# Patient Record
Sex: Male | Born: 1960 | Race: White | Hispanic: No | Marital: Married | Smoking: Never smoker
Health system: Southern US, Community
[De-identification: ages and names within clinical notes are randomized; demographics above are authoritative.]

## PROBLEM LIST (undated history)

## (undated) DIAGNOSIS — I480 Paroxysmal atrial fibrillation: Secondary | ICD-10-CM

## (undated) DIAGNOSIS — I493 Ventricular premature depolarization: Secondary | ICD-10-CM

## (undated) DIAGNOSIS — R002 Palpitations: Secondary | ICD-10-CM

## (undated) DIAGNOSIS — R5383 Other fatigue: Secondary | ICD-10-CM

## (undated) HISTORY — PX: ATRIAL FLUTTER ABLATION: SHX5733

## (undated) HISTORY — PX: TONSILLECTOMY: SUR1361

## (undated) HISTORY — PX: OTHER SURGICAL HISTORY: SHX169

## (undated) HISTORY — PX: COLONOSCOPY: SHX174

## (undated) HISTORY — PX: RECONSTRUCTION OF NOSE: SHX2301

## (undated) HISTORY — DX: Other fatigue: R53.83

## (undated) HISTORY — DX: Palpitations: R00.2

## (undated) HISTORY — DX: Paroxysmal atrial fibrillation: I48.0

## (undated) HISTORY — DX: Ventricular premature depolarization: I49.3

---

## 2020-06-25 ENCOUNTER — Encounter: Payer: Self-pay | Admitting: Cardiology

## 2020-06-25 ENCOUNTER — Ambulatory Visit (INDEPENDENT_AMBULATORY_CARE_PROVIDER_SITE_OTHER): Payer: 59 | Admitting: Cardiology

## 2020-06-25 ENCOUNTER — Other Ambulatory Visit: Payer: Self-pay

## 2020-06-25 VITALS — BP 100/64 | HR 63 | Ht 71.5 in | Wt 153.6 lb

## 2020-06-25 DIAGNOSIS — I480 Paroxysmal atrial fibrillation: Secondary | ICD-10-CM | POA: Insufficient documentation

## 2020-06-25 DIAGNOSIS — I4892 Unspecified atrial flutter: Secondary | ICD-10-CM

## 2020-06-25 NOTE — Progress Notes (Signed)
Electrophysiology Office Note:    Date:  06/25/2020   ID:  Richard Walsh, DOB Jan 10, 1961, MRN [id number]  PCP:  Ginger Organ  [place] Cardiologist:  No primary care provider on file.  [facility] Electrophysiologist:  None   Referring MD: Cory Munch, PA-C   Chief Complaint: Atrial fibrillation  History of Present Illness:    Richard Walsh is a 59 y.o. male who presents for an evaluation of atrial fibrillation at the request of Collene Mares, PA-C. Their medical history includes atrial fibrillation, atrial flutter post atrial flutter ablation 2017, PVCs.  He is recently moved to the area from [place] where he was being followed by the [facility].  He tells me that he first was diagnosed with atrial flutter approximately 4 years ago.  He was symptomatic with those and underwent a catheter ablation on June 17, 2016.  During the procedure the CTI was ablated.  Multiple episodes of atrial fibrillation were induced during the atrial flutter ablation requiring cardioversion.  Xarelto was used periprocedurally but then discontinued 1 month after the ablation procedure.  He had a loop recorder implanted to monitor for atrial fibrillation.  He began having episodes of atrial fibrillation on the loop and these episodes have become more frequent over time.  Sotalol was used and then uptitrated from 80 mg twice daily to 120 mg twice daily.  He tells me today that the sotalol seem to help the atrial fibrillation episodes but recently he is noted his heart to be out of rhythm more frequently.  Review of his loop recorder tracings confirm this.  He tells me today that he is not very active but does move about the house in the yard doing work.  He notices no limitations in his exercise capacity.  No syncope or presyncope.  He feels his palpitations sometimes when he lays down.    Past Medical History:  Diagnosis Date  . Fatigue   . Palpitations   .  Paroxysmal atrial fibrillation (HCC)   . Ventricular premature beats     Past Surgical History:  Procedure Laterality Date  . ABLATE HEART DYSRHYTHM FOCUS    . ATRIAL FLUTTER ABLATION    . IMPLANTABLE LOOP RECORDER    . RECONSTRUCTION OF NOSE    . TONSILLECTOMY      Current Medications: Current Meds  Medication Sig  . ergocalciferol (VITAMIN D2) 1.25 MG (50000 UT) capsule Take 50,000 Units by mouth once a week.  . sotalol (BETAPACE) 120 MG tablet Take 120 mg by mouth in the morning and at bedtime.  . sotalol (BETAPACE) 80 MG tablet Take 80 mg by mouth 2 (two) times daily.  Marland Kitchen VITAMIN D PO Take 50,000 Units by mouth.     Allergies:   Penicillins   Social History   Socioeconomic History  . Marital status: Married    Spouse name: Not on file  . Number of children: Not on file  . Years of education: Not on file  . Highest education level: Not on file  Occupational History  . Not on file  Tobacco Use  . Smoking status: Never Smoker  . Smokeless tobacco: Never Used  Substance and Sexual Activity  . Alcohol use: Not on file  . Drug use: Not on file  . Sexual activity: Not on file  Other Topics Concern  . Not on file  Social History Narrative  . Not on file   Social Determinants of Health   Financial  Resource Strain:   . Difficulty of Paying Living Expenses: Not on file  Food Insecurity:   . Worried About Charity fundraiser in the Last Year: Not on file  . Ran Out of Food in the Last Year: Not on file  Transportation Needs:   . Lack of Transportation (Medical): Not on file  . Lack of Transportation (Non-Medical): Not on file  Physical Activity:   . Days of Exercise per Week: Not on file  . Minutes of Exercise per Session: Not on file  Stress:   . Feeling of Stress : Not on file  Social Connections:   . Frequency of Communication with Friends and Family: Not on file  . Frequency of Social Gatherings with Friends and Family: Not on file  . Attends Religious  Services: Not on file  . Active Member of Clubs or Organizations: Not on file  . Attends Archivist Meetings: Not on file  . Marital Status: Not on file     Family History: The patient's family history is not on file.  ROS:   Please see the history of present illness.    All other systems reviewed and are negative.  EKGs/Labs/Other Studies Reviewed:    The following studies were reviewed today: Prior notes from [place], EKG  EKG:  The ekg ordered today demonstrates sinus rhythm  Loop interrogation in office personally reviewed shows 2 episodes of atrial fibrillation in October, 3 in September, 2 in August, 2 in July, 3 in June, 6 in May, 12 in April.  Episodes range from a few seconds to over 6-1/2 hours with ventricular rates.  Review of the tracings shows what appears to be either coarse atrial fibrillation or atrial flutter  Recent Labs: No results found for requested labs within last 8760 hours.  Recent Lipid Panel No results found for: CHOL, TRIG, HDL, CHOLHDL, VLDL, LDLCALC, LDLDIRECT  Physical Exam:    VS:  BP 100/64   Pulse 63   Ht 5' 11.5" (1.816 m)   Wt 153 lb 9.6 oz (69.7 kg)   SpO2 97%   BMI 21.12 kg/m     Wt Readings from Last 3 Encounters:  06/25/20 153 lb 9.6 oz (69.7 kg)     GEN:  Well nourished, well developed in no acute distress HEENT: Normal NECK: No JVD; No carotid bruits LYMPHATICS: No lymphadenopathy CARDIAC: RRR, no murmurs, rubs, gallops RESPIRATORY:  Clear to auscultation without rales, wheezing or rhonchi  ABDOMEN: Soft, non-tender, non-distended MUSCULOSKELETAL:  No edema; No deformity  SKIN: Warm and dry NEUROLOGIC:  Alert and oriented x 3 PSYCHIATRIC:  Normal affect   ASSESSMENT:    1. Paroxysmal atrial fibrillation (HCC)    PLAN:    In order of problems listed above:  1. Paroxysmal atrial fibrillation and flutter Post atrial flutter ablation in 2017.  Patient continues to have (mildly) symptomatic episodes of atrial  fibrillation and possibly atrial flutter on his loop recorder.  He is not currently anticoagulated given a CHA2DS2-VASc of 0.  He is interested in definitive management for his arrhythmias given the fear that these will progress as he ages and become more symptomatic.  He understands the pathophysiology of atrial fibrillation and flutter and the fact that these will likely become more frequent and longer in duration.  We discussed the options for managing his atrial fibrillation including #1 continued watchful waiting and monitoring of his loop recorder to detect a change in frequency of the atrial arrhythmias, #2 antiarrhythmic therapy and #  3 ablation therapy.  Antiarrhythmic options for Mr. Dunker include class Ic's, dronedarone, amiodarone, and Tikosyn.  It is concerning to me that despite being on a good dose of sotalol he continues to have episodes of atrial arrhythmias.  Given that fact, I would not use class Ic 0 dronedarone.  Given his young age, amiodarone is not a good option.  That leaves Tikosyn.  After discussing the use of Tikosyn with the patient,'s pacifically the requirement that this medicine be started as an inpatient, the patient is understandably hesitant to pursue this option.  I discussed ablation at length during today's visit with the patient and his wife including the risks, benefits, expected success rates and potential need for future procedures or antiarrhythmic therapy.  He plans to think about it after leaving the office today and will let us know if he would like to schedule.  We provided him with several procedure dates in the coming months.  If he would like to schedule, we will need to start him on anticoagulation (Xarelto) for at least 3 weeks prior to the procedure.  This will be continued for at least 3 months after the procedure.  We will also need a CT scan to assess his cardiac anatomy especially in the presence of his pectus excavatum.  Risk, benefits, and alternatives  to EP study and radiofrequency ablation for afib were also discussed in detail today. These risks include but are not limited to stroke, bleeding, vascular damage, tamponade, perforation, damage to the esophagus, lungs, and other structures, pulmonary vein stenosis, worsening renal function, and death. The patient understands these risk.  Carto, ICE, anesthesia are requested for the procedure.  Will also obtain CT PV protocol prior to the procedure to exclude LAA thrombus and further evaluate atrial anatomy.    Medication Adjustments/Labs and Tests Ordered: Current medicines are reviewed at length with the patient today.  Concerns regarding medicines are outlined above.  Orders Placed This Encounter  Procedures  . EKG 12-Lead   No orders of the defined types were placed in this encounter.    Signed, Lars Mage, MD, [facility]  06/25/2020 6:14 PM    Electrophysiology [facility]

## 2020-06-25 NOTE — Patient Instructions (Addendum)
Medication Instructions:  Your physician recommends that you continue on your current medications as directed. Please refer to the Current Medication list given to you today.  Labwork: None ordered.  Testing/Procedures: None ordered.  Follow-Up:  The following dates are available in January: January 3, 7, 10, 14, 17, 24, 28  Any Other Special Instructions Will Be Listed Below (If Applicable).  If you need a refill on your cardiac medications before your next appointment, please call your pharmacy.    Cardiac Ablation Cardiac ablation is a procedure to disable (ablate) a small amount of heart tissue in very specific places. The heart has many electrical connections. Sometimes these connections are abnormal and can cause the heart to beat very fast or irregularly. Ablating some of the problem areas can improve the heart rhythm or return it to normal. Ablation may be done for people who:  Have Wolff-Parkinson-White syndrome.  Have fast heart rhythms (tachycardia).  Have taken medicines for an abnormal heart rhythm (arrhythmia) that were not effective or caused side effects.  Have a high-risk heartbeat that may be life-threatening. During the procedure, a small incision is made in the neck or the groin, and a long, thin, flexible tube (catheter) is inserted into the incision and moved to the heart. Small devices (electrodes) on the tip of the catheter will send out electrical currents. A type of X-ray (fluoroscopy) will be used to help guide the catheter and to provide images of the heart. Tell a health care provider about:  Any allergies you have.  All medicines you are taking, including vitamins, herbs, eye drops, creams, and over-the-counter medicines.  Any problems you or family members have had with anesthetic medicines.  Any blood disorders you have.  Any surgeries you have had.  Any medical conditions you have, such as kidney failure.  Whether you are pregnant or may be  pregnant. What are the risks? Generally, this is a safe procedure. However, problems may occur, including:  Infection.  Bruising and bleeding at the catheter insertion site.  Bleeding into the chest, especially into the sac that surrounds the heart. This is a serious complication.  Stroke or blood clots.  Damage to other structures or organs.  Allergic reaction to medicines or dyes.  Need for a permanent pacemaker if the normal electrical system is damaged. A pacemaker is a small computer that sends electrical signals to the heart and helps your heart beat normally.  The procedure not being fully effective. This may not be recognized until months later. Repeat ablation procedures are sometimes required. What happens before the procedure?  Follow instructions from your health care provider about eating or drinking restrictions.  Ask your health care provider about: ? Changing or stopping your regular medicines. This is especially important if you are taking diabetes medicines or blood thinners. ? Taking medicines such as aspirin and ibuprofen. These medicines can thin your blood. Do not take these medicines before your procedure if your health care provider instructs you not to.  Plan to have someone take you home from the hospital or clinic.  If you will be going home right after the procedure, plan to have someone with you for 24 hours. What happens during the procedure?  To lower your risk of infection: ? Your health care team will wash or sanitize their hands. ? Your skin will be washed with soap. ? Hair may be removed from the incision area.  An IV tube will be inserted into one of your veins.  You  will be given a medicine to help you relax (sedative).  The skin on your neck or groin will be numbed.  An incision will be made in your neck or your groin.  A needle will be inserted through the incision and into a large vein in your neck or groin.  A catheter will be  inserted into the needle and moved to your heart.  Dye may be injected through the catheter to help your surgeon see the area of the heart that needs treatment.  Electrical currents will be sent from the catheter to ablate heart tissue in desired areas. There are three types of energy that may be used to ablate heart tissue: ? Heat (radiofrequency energy). ? Laser energy. ? Extreme cold (cryoablation).  When the necessary tissue has been ablated, the catheter will be removed.  Pressure will be held on the catheter insertion area to prevent excessive bleeding.  A bandage (dressing) will be placed over the catheter insertion area. The procedure may vary among health care providers and hospitals. What happens after the procedure?  Your blood pressure, heart rate, breathing rate, and blood oxygen level will be monitored until the medicines you were given have worn off.  Your catheter insertion area will be monitored for bleeding. You will need to lie still for a few hours to ensure that you do not bleed from the catheter insertion area.  Do not drive for 24 hours or as long as directed by your health care provider. Summary  Cardiac ablation is a procedure to disable (ablate) a small amount of heart tissue in very specific places. Ablating some of the problem areas can improve the heart rhythm or return it to normal.  During the procedure, electrical currents will be sent from the catheter to ablate heart tissue in desired areas. This information is not intended to replace advice given to you by your health care provider. Make sure you discuss any questions you have with your health care provider. Document Revised: 01/30/2018 Document Reviewed: 06/28/2016 Elsevier Patient Education  [place].

## 2020-06-26 ENCOUNTER — Telehealth: Payer: Self-pay | Admitting: Cardiology

## 2020-06-26 MED ORDER — SOTALOL HCL 120 MG PO TABS
120.0000 mg | ORAL_TABLET | Freq: Two times a day (BID) | ORAL | 3 refills | Status: DC
Start: 2020-06-26 — End: 2021-06-22

## 2020-06-26 NOTE — Telephone Encounter (Signed)
New message      *STAT* If patient is at the pharmacy, call can be transferred to refill team.   1. Which medications need to be refilled? (please list name of each medication and dose if known) sotalol (BETAPACE) 120 MG tablet  2. Which pharmacy/location (including street and city if local pharmacy) is medication to be sent to?  [facility] [address]   3. Do they need a 30 day or 90 day supply? [place]

## 2020-06-26 NOTE — Telephone Encounter (Signed)
Pt's medication was sent to pt's pharmacy as requested. Pt stated that he now takes sotalol 120 mg BID.  That's what was sent to pt's pharmacy. Confirmation received.

## 2020-06-27 ENCOUNTER — Telehealth: Payer: Self-pay | Admitting: Cardiology

## 2020-06-27 DIAGNOSIS — I4891 Unspecified atrial fibrillation: Secondary | ICD-10-CM

## 2020-06-27 NOTE — Telephone Encounter (Signed)
Pt called and would like to sch his Cardiac ablation .    Best number -[phone]

## 2020-06-30 MED ORDER — RIVAROXABAN 20 MG PO TABS: 20.0000 mg | ORAL_TABLET | Freq: Every day | ORAL | 11 refills | Status: DC

## 2020-06-30 NOTE — Telephone Encounter (Signed)
Pt is scheduled for afib ablation on September 08, 2020 at 10:30 AM.  Pt will contact this nurse to advise of lab date prior to procedure.

## 2020-07-01 ENCOUNTER — Telehealth: Payer: Self-pay | Admitting: Emergency Medicine

## 2020-07-01 NOTE — Telephone Encounter (Signed)
[facility] in [place] , [place] to request transfer of patient to [facility]. Left message for Yolande Jolly in device clinic to return call with DC # and office hours.  Need seriel # of Loop Recorder and industry.

## 2020-07-01 NOTE — Telephone Encounter (Signed)
-----   Message from Damian Leavell, RN sent at 07/01/2020  8:34 AM EST ----- Regarding: loop recorder-i can't remember who checked him.  new patient

## 2020-07-01 NOTE — Telephone Encounter (Signed)
Patient released to our clinic in [place].

## 2020-07-24 NOTE — Telephone Encounter (Signed)
Pt scheduled for afib ablation.  Work up complete

## 2020-08-12 ENCOUNTER — Other Ambulatory Visit: Payer: Self-pay

## 2020-08-12 ENCOUNTER — Telehealth: Payer: Self-pay | Admitting: Cardiology

## 2020-08-12 ENCOUNTER — Other Ambulatory Visit: Payer: 59

## 2020-08-12 DIAGNOSIS — I4891 Unspecified atrial fibrillation: Secondary | ICD-10-CM

## 2020-08-12 NOTE — Telephone Encounter (Signed)
**Note De-Identified [name] Obfuscation** The pt states that he was not given a Xarelto co-pay card. I advised him that we will leave one in our front office for him to pick up today while at the office for lab work.  Per his request I will do the PA on his Eliquis as well.

## 2020-08-12 NOTE — Telephone Encounter (Signed)
**Note De-Identified Meeghan Skipper Obfuscation** Per the pts chart he has [employer] so he should be able to use a $10 co-pay card. I will call the pt to verify and if this is correct I will ask someone at the office to leave the pt a Xarelto co-pay card in the front office so the pt can pick it up.

## 2020-08-12 NOTE — Telephone Encounter (Signed)
Will do, thanks

## 2020-08-12 NOTE — Telephone Encounter (Signed)
New Message  Pt c/o medication issue:  1. Name of Medication: rivaroxaban (XARELTO) 20 MG TABS tablet  2. How are you currently taking this medication (dosage and times per day)? 1 tablet daily   3. Are you having a reaction (difficulty breathing--STAT)? No   4. What is your medication issue? Pt says the pharmacy will not refill without Prior Auth  Pt had this number for his insurance for Prior Auth with his Insurance  [phone]

## 2020-08-13 ENCOUNTER — Telehealth: Payer: Self-pay

## 2020-08-13 LAB — CBC WITH DIFFERENTIAL/PLATELET
Basophils Absolute: 0 10*3/uL (ref 0.0–0.2)
Basos: 0 %
EOS (ABSOLUTE): 0.1 10*3/uL (ref 0.0–0.4)
Eos: 2 %
Hematocrit: 45 % (ref 37.5–51.0)
Hemoglobin: 15.4 g/dL (ref 13.0–17.7)
Immature Grans (Abs): 0 10*3/uL (ref 0.0–0.1)
Immature Granulocytes: 0 %
Lymphocytes Absolute: 1.8 10*3/uL (ref 0.7–3.1)
Lymphs: 29 %
MCH: 31.3 pg (ref 26.6–33.0)
MCHC: 34.2 g/dL (ref 31.5–35.7)
MCV: 92 fL (ref 79–97)
Monocytes Absolute: 0.6 10*3/uL (ref 0.1–0.9)
Monocytes: 10 %
Neutrophils Absolute: 3.8 10*3/uL (ref 1.4–7.0)
Neutrophils: 59 %
Platelets: 238 10*3/uL (ref 150–450)
RBC: 4.92 x10E6/uL (ref 4.14–5.80)
RDW: 12.6 % (ref 11.6–15.4)
WBC: 6.3 10*3/uL (ref 3.4–10.8)

## 2020-08-13 LAB — BASIC METABOLIC PANEL
BUN/Creatinine Ratio: 19 (ref 9–20)
BUN: 16 mg/dL (ref 6–24)
CO2: 27 mmol/L (ref 20–29)
Calcium: 9.7 mg/dL (ref 8.7–10.2)
Chloride: 101 mmol/L (ref 96–106)
Creatinine, Ser: 0.83 mg/dL (ref 0.76–1.27)
GFR calc Af Amer: 113 mL/min/{1.73_m2} (ref >59)
GFR calc non Af Amer: 98 mL/min/{1.73_m2} (ref >59)
Glucose: 106 mg/dL — ABNORMAL HIGH (ref 65–99)
Potassium: 4.6 mmol/L (ref 3.5–5.2)
Sodium: 142 mmol/L (ref 134–144)

## 2020-08-13 NOTE — Telephone Encounter (Signed)
**Note De-Identified Nafisah Runions Obfuscation** Using ins information provided by [facility] as follows: ID: [id number] BIN: [id number] PCN: [id number] GRP: [id number]  I did a Eliquis PA through covermymeds and received the following message: Particia Lather Key: [facility] [place] Case ID: [id number] Need help? Call us at [phone] Outcome: This medication or product is on your plan's list of covered drugs. Prior authorization is not required at this time. If your pharmacy has questions regarding the processing of your prescription, please have them call the [facility] pharmacy help desk at [phone].  Drug: Xarelto 20MG  tablets Form: [facility] Electronic Prior Authorization Form (2017 NCPDP)  I have notified [facility] of this outcome.

## 2020-08-13 NOTE — Telephone Encounter (Signed)
**Note De-Identified Richard Walsh Obfuscation** Letter received from [facility] Nathanie Ottley fax stating that they did cancel the pts Xarelto PA as it is not required and is covered by the pts plan.

## 2020-09-01 ENCOUNTER — Telehealth (HOSPITAL_COMMUNITY): Payer: Self-pay | Admitting: Emergency Medicine

## 2020-09-01 NOTE — Telephone Encounter (Signed)
Attempted to call patient regarding upcoming cardiac CT appointment. °Left message on voicemail with name and callback number °Vihan Santagata RN Navigator Cardiac Imaging °[facility] °[phone] Office °[phone] Cell ° °

## 2020-09-02 ENCOUNTER — Other Ambulatory Visit: Payer: Self-pay

## 2020-09-02 ENCOUNTER — Ambulatory Visit (HOSPITAL_COMMUNITY)
Admission: RE | Admit: 2020-09-02 | Discharge: 2020-09-02 | Disposition: A | Discharge status: Home / Self Care | Payer: 59 | Source: Ambulatory Visit | Attending: Cardiology | Admitting: Cardiology

## 2020-09-02 DIAGNOSIS — I4891 Unspecified atrial fibrillation: Secondary | ICD-10-CM | POA: Insufficient documentation

## 2020-09-02 MED ORDER — IOHEXOL 350 MG/ML SOLN
80.0000 mL | Freq: Once | INTRAVENOUS | Status: AC | PRN
  Administered 2020-09-02: 80 mL via INTRAVENOUS

## 2020-09-03 ENCOUNTER — Telehealth: Payer: Self-pay

## 2020-09-03 NOTE — Telephone Encounter (Signed)
Call placed to Pt.  Advised need to reschedule afib ablation.  Dates sent via mychart per Pt request.  He will advise what next date works best.

## 2020-09-04 ENCOUNTER — Telehealth: Payer: Self-pay | Admitting: Cardiology

## 2020-09-04 ENCOUNTER — Encounter: Payer: Self-pay | Admitting: Interventional Cardiology

## 2020-09-04 NOTE — Telephone Encounter (Signed)
Pt rescheduled for ablation October 06, 2020 at 7:30 am.  covid test rescheduled.  precert and scheduling aware.  Instruction letter updated and sent via mychart.  Work up complete

## 2020-09-04 NOTE — Telephone Encounter (Signed)
error 

## 2020-09-04 NOTE — Telephone Encounter (Signed)
Patient calling to speak with Sonia Baller, states he needs to reschedule his ablation.

## 2020-09-06 ENCOUNTER — Other Ambulatory Visit (HOSPITAL_COMMUNITY): Payer: 59

## 2020-10-03 NOTE — Progress Notes (Signed)
Instructed patient on the following items: Arrival time 0530 Nothing to eat or drink after midnight No meds AM of procedure Responsible person to drive you home and stay with you for 24 hrs  Have you missed any doses of anti-coagulant Xarelto- hasn't missed any doses    

## 2020-10-04 ENCOUNTER — Other Ambulatory Visit (HOSPITAL_COMMUNITY)
Admission: RE | Admit: 2020-10-04 | Discharge: 2020-10-04 | Disposition: A | Discharge status: Home / Self Care | Payer: 59 | Source: Ambulatory Visit | Attending: Cardiology | Admitting: Cardiology

## 2020-10-04 DIAGNOSIS — Z01812 Encounter for preprocedural laboratory examination: Secondary | ICD-10-CM | POA: Diagnosis present

## 2020-10-04 DIAGNOSIS — Z20822 Contact with and (suspected) exposure to covid-19: Secondary | ICD-10-CM | POA: Diagnosis not present

## 2020-10-05 LAB — SARS CORONAVIRUS 2 (TAT 6-24 HRS): SARS Coronavirus 2: NEGATIVE

## 2020-10-05 NOTE — Anesthesia Preprocedure Evaluation (Signed)
Anesthesia Evaluation  Patient identified by MRN, date of birth, ID band Patient awake    Reviewed: Allergy & Precautions, NPO status , Patient's Chart, lab work & pertinent test results  Airway Mallampati: III  TM Distance: <3 FB   Mouth opening: Limited Mouth Opening  Dental no notable dental hx.    Pulmonary neg pulmonary ROS,    Pulmonary exam normal breath sounds clear to auscultation       Cardiovascular Exercise Tolerance: Good Normal cardiovascular exam+ dysrhythmias Atrial Fibrillation  Rhythm:Regular Rate:Normal     Neuro/Psych negative neurological ROS  negative psych ROS   GI/Hepatic negative GI ROS, Neg liver ROS,   Endo/Other  negative endocrine ROS  Renal/GU negative Renal ROS     Musculoskeletal negative musculoskeletal ROS (+)   Abdominal   Peds negative pediatric ROS (+)  Hematology negative hematology ROS (+)   Anesthesia Other Findings   Reproductive/Obstetrics                            Anesthesia Physical Anesthesia Plan  ASA: II  Anesthesia Plan: General   Post-op Pain Management:    Induction: Intravenous  PONV Risk Score and Plan: 2 and Ondansetron, Dexamethasone, Midazolam and Treatment may vary due to age or medical condition  Airway Management Planned: Oral ETT  Additional Equipment: None  Intra-op Plan:   Post-operative Plan: Extubation in OR  Informed Consent: I have reviewed the patients History and Physical, chart, labs and discussed the procedure including the risks, benefits and alternatives for the proposed anesthesia with the patient or authorized representative who has indicated his/her understanding and acceptance.       Plan Discussed with: CRNA and Anesthesiologist  Anesthesia Plan Comments:        Anesthesia Quick Evaluation

## 2020-10-06 ENCOUNTER — Encounter (HOSPITAL_COMMUNITY): Payer: Self-pay | Admitting: Cardiology

## 2020-10-06 ENCOUNTER — Encounter (HOSPITAL_COMMUNITY)
Admission: RE | Disposition: A | Discharge status: Home / Self Care | Payer: Self-pay | Source: Home / Self Care | Attending: Cardiology

## 2020-10-06 ENCOUNTER — Ambulatory Visit (HOSPITAL_COMMUNITY): Payer: 59 | Admitting: Certified Registered Nurse Anesthetist

## 2020-10-06 ENCOUNTER — Ambulatory Visit (HOSPITAL_COMMUNITY): Payer: 59 | Admitting: Nurse Practitioner

## 2020-10-06 ENCOUNTER — Other Ambulatory Visit: Payer: Self-pay

## 2020-10-06 ENCOUNTER — Ambulatory Visit (HOSPITAL_COMMUNITY)
Admission: RE | Admit: 2020-10-06 | Discharge: 2020-10-06 | Disposition: A | Discharge status: Home / Self Care | Payer: 59 | Attending: Cardiology | Admitting: Cardiology

## 2020-10-06 DIAGNOSIS — I480 Paroxysmal atrial fibrillation: Secondary | ICD-10-CM | POA: Insufficient documentation

## 2020-10-06 DIAGNOSIS — Z79899 Other long term (current) drug therapy: Secondary | ICD-10-CM | POA: Insufficient documentation

## 2020-10-06 DIAGNOSIS — I4892 Unspecified atrial flutter: Secondary | ICD-10-CM | POA: Insufficient documentation

## 2020-10-06 DIAGNOSIS — Z880 Allergy status to penicillin: Secondary | ICD-10-CM | POA: Insufficient documentation

## 2020-10-06 HISTORY — PX: ATRIAL FIBRILLATION ABLATION: EP1191

## 2020-10-06 LAB — CBC
HCT: 42.4 % (ref 39.0–52.0)
Hemoglobin: 15.1 g/dL (ref 13.0–17.0)
MCH: 31.7 pg (ref 26.0–34.0)
MCHC: 35.6 g/dL (ref 30.0–36.0)
MCV: 88.9 fL (ref 80.0–100.0)
Platelets: 214 10*3/uL (ref 150–400)
RBC: 4.77 MIL/uL (ref 4.22–5.81)
RDW: 12.4 % (ref 11.5–15.5)
WBC: 6.3 10*3/uL (ref 4.0–10.5)
nRBC: 0 % (ref 0.0–0.2)

## 2020-10-06 LAB — BASIC METABOLIC PANEL
Anion gap: 9 (ref 5–15)
BUN: 15 mg/dL (ref 6–20)
CO2: 27 mmol/L (ref 22–32)
Calcium: 9.2 mg/dL (ref 8.9–10.3)
Chloride: 104 mmol/L (ref 98–111)
Creatinine, Ser: 0.86 mg/dL (ref 0.61–1.24)
GFR, Estimated: >60 mL/min (ref >60)
Glucose, Bld: 90 mg/dL (ref 70–99)
Potassium: 4.1 mmol/L (ref 3.5–5.1)
Sodium: 140 mmol/L (ref 135–145)

## 2020-10-06 LAB — POCT ACTIVATED CLOTTING TIME
Activated Clotting Time: 350 seconds
Activated Clotting Time: 380 seconds

## 2020-10-06 SURGERY — ATRIAL FIBRILLATION ABLATION
Anesthesia: General

## 2020-10-06 MED ORDER — SUGAMMADEX SODIUM 200 MG/2ML IV SOLN
INTRAVENOUS | Status: DC | PRN
  Administered 2020-10-06: 200 mg via INTRAVENOUS

## 2020-10-06 MED ORDER — SODIUM CHLORIDE 0.9 % IV SOLN: INTRAVENOUS | Status: DC

## 2020-10-06 MED ORDER — ACETAMINOPHEN 325 MG PO TABS
650.0000 mg | ORAL_TABLET | ORAL | Status: DC | PRN
  Filled 2020-10-06: qty 2

## 2020-10-06 MED ORDER — ONDANSETRON HCL 4 MG/2ML IJ SOLN: 4.0000 mg | Freq: Four times a day (QID) | INTRAMUSCULAR | Status: DC | PRN

## 2020-10-06 MED ORDER — COLCHICINE 0.6 MG PO TABS
0.6000 mg | ORAL_TABLET | Freq: Two times a day (BID) | ORAL | Status: DC
  Administered 2020-10-06: 0.6 mg via ORAL
  Filled 2020-10-06: qty 1

## 2020-10-06 MED ORDER — SODIUM CHLORIDE 0.9% FLUSH: 3.0000 mL | INTRAVENOUS | Status: DC | PRN

## 2020-10-06 MED ORDER — HEPARIN (PORCINE) IN NACL 1000-0.9 UT/500ML-% IV SOLN
INTRAVENOUS | Status: AC
  Filled 2020-10-06: qty 500

## 2020-10-06 MED ORDER — COLCHICINE 0.6 MG PO TABS: 0.6000 mg | ORAL_TABLET | Freq: Two times a day (BID) | ORAL | 0 refills | Status: DC

## 2020-10-06 MED ORDER — PANTOPRAZOLE SODIUM 40 MG PO TBEC
40.0000 mg | DELAYED_RELEASE_TABLET | Freq: Every day | ORAL | Status: DC
  Administered 2020-10-06: 40 mg via ORAL
  Filled 2020-10-06: qty 1

## 2020-10-06 MED ORDER — PANTOPRAZOLE SODIUM 40 MG PO TBEC: 40.0000 mg | DELAYED_RELEASE_TABLET | Freq: Every day | ORAL | 0 refills | Status: DC

## 2020-10-06 MED ORDER — DEXAMETHASONE SODIUM PHOSPHATE 10 MG/ML IJ SOLN
INTRAMUSCULAR | Status: DC | PRN
  Administered 2020-10-06: 10 mg via INTRAVENOUS

## 2020-10-06 MED ORDER — MIDAZOLAM HCL 2 MG/2ML IJ SOLN
INTRAMUSCULAR | Status: DC | PRN
  Administered 2020-10-06: 2 mg via INTRAVENOUS

## 2020-10-06 MED ORDER — PHENYLEPHRINE HCL-NACL 10-0.9 MG/250ML-% IV SOLN
INTRAVENOUS | Status: DC | PRN
  Administered 2020-10-06: 40 ug/min via INTRAVENOUS

## 2020-10-06 MED ORDER — PROPOFOL 10 MG/ML IV BOLUS
INTRAVENOUS | Status: DC | PRN
  Administered 2020-10-06: 150 mg via INTRAVENOUS

## 2020-10-06 MED ORDER — PROTAMINE SULFATE 10 MG/ML IV SOLN
INTRAVENOUS | Status: DC | PRN
  Administered 2020-10-06: 30 mg via INTRAVENOUS

## 2020-10-06 MED ORDER — FENTANYL CITRATE (PF) 250 MCG/5ML IJ SOLN
INTRAMUSCULAR | Status: DC | PRN
  Administered 2020-10-06: 50 ug via INTRAVENOUS

## 2020-10-06 MED ORDER — HEPARIN (PORCINE) IN NACL 1000-0.9 UT/500ML-% IV SOLN
INTRAVENOUS | Status: DC | PRN
  Administered 2020-10-06 (×3): 500 mL

## 2020-10-06 MED ORDER — ONDANSETRON HCL 4 MG/2ML IJ SOLN
INTRAMUSCULAR | Status: DC | PRN
  Administered 2020-10-06: 4 mg via INTRAVENOUS

## 2020-10-06 MED ORDER — HEPARIN SODIUM (PORCINE) 1000 UNIT/ML IJ SOLN
INTRAMUSCULAR | Status: DC | PRN
  Administered 2020-10-06: 12000 [IU] via INTRAVENOUS

## 2020-10-06 MED ORDER — SODIUM CHLORIDE 0.9 % IV SOLN: 250.0000 mL | INTRAVENOUS | Status: DC | PRN

## 2020-10-06 MED ORDER — SODIUM CHLORIDE 0.9% FLUSH: 3.0000 mL | Freq: Two times a day (BID) | INTRAVENOUS | Status: DC

## 2020-10-06 MED ORDER — LIDOCAINE 2% (20 MG/ML) 5 ML SYRINGE
INTRAMUSCULAR | Status: DC | PRN
  Administered 2020-10-06: 80 mg via INTRAVENOUS

## 2020-10-06 MED ORDER — ROCURONIUM BROMIDE 10 MG/ML (PF) SYRINGE
PREFILLED_SYRINGE | INTRAVENOUS | Status: DC | PRN
  Administered 2020-10-06: 60 mg via INTRAVENOUS

## 2020-10-06 MED ORDER — HEPARIN SODIUM (PORCINE) 1000 UNIT/ML IJ SOLN
INTRAMUSCULAR | Status: AC
  Filled 2020-10-06: qty 1

## 2020-10-06 SURGICAL SUPPLY — 21 items
BLANKET WARM UNDERBOD FULL ACC (MISCELLANEOUS) ×2 IMPLANT
CATH 8FR REPROCESSED SOUNDSTAR (CATHETERS) ×2 IMPLANT
CATH MAPPNG PENTARAY F 2-6-2MM (CATHETERS) ×1 IMPLANT
CATH S CIRCA THERM PROBE 10F (CATHETERS) ×2 IMPLANT
CATH SMTCH THERMOCOOL SF DF (CATHETERS) ×2 IMPLANT
CATH WEBSTER BI DIR CS D-F CRV (CATHETERS) ×2 IMPLANT
CLOSURE PERCLOSE PROSTYLE (VASCULAR PRODUCTS) ×8 IMPLANT
COVER SWIFTLINK CONNECTOR (BAG) ×2 IMPLANT
MAT PREVALON FULL STRYKER (MISCELLANEOUS) ×2 IMPLANT
PACK EP LATEX FREE (CUSTOM PROCEDURE TRAY) ×2
PACK EP LF (CUSTOM PROCEDURE TRAY) ×1 IMPLANT
PAD PRO RADIOLUCENT 2001M-C (PAD) ×2 IMPLANT
PATCH CARTO3 (PAD) ×2 IMPLANT
PENTARAY F 2-6-2MM (CATHETERS) ×2
SHEATH BAYLIS TRANSSEPTAL 98CM (NEEDLE) ×2 IMPLANT
SHEATH CARTO VIZIGO SM CVD (SHEATH) ×2 IMPLANT
SHEATH PINNACLE 7F 10CM (SHEATH) IMPLANT
SHEATH PINNACLE 8F 10CM (SHEATH) ×4 IMPLANT
SHEATH PINNACLE 9F 10CM (SHEATH) ×2 IMPLANT
SHEATH PROBE COVER 6X72 (BAG) ×2 IMPLANT
TUBING SMART ABLATE COOLFLOW (TUBING) ×2 IMPLANT

## 2020-10-06 NOTE — Anesthesia Procedure Notes (Signed)
Procedure Name: Intubation Date/Time: 10/06/2020 7:46 AM Performed by: Valda Favia, CRNA Pre-anesthesia Checklist: Patient identified, Emergency Drugs available, Suction available and Patient being monitored Patient Re-evaluated:Patient Re-evaluated prior to induction Oxygen Delivery Method: Circle System Utilized Preoxygenation: Pre-oxygenation with 100% oxygen Induction Type: IV induction Ventilation: Mask ventilation without difficulty Laryngoscope Size: Mac and 4 Grade View: Grade II Tube type: Oral Tube size: 7.5 mm Number of attempts: 1 Airway Equipment and Method: Stylet and Oral airway Placement Confirmation: ETT inserted through vocal cords under direct vision,  positive ETCO2 and breath sounds checked- equal and bilateral Secured at: 22 cm Tube secured with: Tape Dental Injury: Teeth and Oropharynx as per pre-operative assessment

## 2020-10-06 NOTE — Transfer of Care (Signed)
**Note Richard-Identified via Obfuscation** Immediate Anesthesia Transfer of Care Note  Patient: Richard Walsh  Procedure(s) Performed: ATRIAL FIBRILLATION ABLATION (N/A )  Patient Location: Cath Lab  Anesthesia Type:General  Level of Consciousness: drowsy  Airway & Oxygen Therapy: Patient Spontanous Breathing and Patient connected to nasal cannula oxygen  Post-op Assessment: Report given to RN and Post -op Vital signs reviewed and stable  Post vital signs: Reviewed and stable  Last Vitals:  Vitals Value Taken Time  BP 95/56 10/06/20 1007  Temp    Pulse 72 10/06/20 1010  Resp 13 10/06/20 1010  SpO2 99 % 10/06/20 1010  Vitals shown include unvalidated device data.  Last Pain:  Vitals:   10/06/20 0607  PainSc: 0-No pain         Complications: No complications documented.

## 2020-10-06 NOTE — Discharge Instructions (Signed)
Post procedure care instructions No driving for 4 days. No lifting over 5 lbs for 1 week. No vigorous or sexual activity for 1 week. You may return to work/your usual activities on 10/14/20. Keep procedure site clean & dry. If you notice increased pain, swelling, bleeding or pus, call/return!  You may shower after 24 hours, but no soaking in baths/hot tubs/pools for 1 week.    You have an appointment set up with the [facility].  Multiple studies have shown that being followed by a dedicated atrial fibrillation clinic in addition to the standard care you receive from your other physicians improves health. We believe that enrollment in the atrial fibrillation clinic will allow [place] to better care for you.   The phone number to the [facility] is [phone]. The clinic is staffed Monday through Friday from 8:30am to 5pm.  Parking Directions: The clinic is located in the Heart and Vascular Building connected to [facility]. 1)From [address] turn on to [address] and go to the 3rd entrance  (Heart and Vascular entrance) on the right. 2)Look to the right for Heart &Vascular Parking Garage. 3)A code for the entrance is required, for March is 1223 4)Take the elevators to the 1st floor. Registration is in the room with the glass walls at the end of the hallway.  If you have any trouble parking or locating the clinic, please dont hesitate to call [phone].   Cardiac Ablation, Care After  This sheet gives you information about how to care for yourself after your procedure. Your health care provider may also give you more specific instructions. If you have problems or questions, contact your health care provider. What can I expect after the procedure? After the procedure, it is common to have:  Bruising around your puncture site.  Tenderness around your puncture site.  Skipped heartbeats.  Tiredness (fatigue).  Follow these instructions at  home: Puncture site care   Follow instructions from your health care provider about how to take care of your puncture site. Make sure you: ? If present, leave stitches (sutures), skin glue, or adhesive strips in place. These skin closures may need to stay in place for up to 2 weeks. If adhesive strip edges start to loosen and curl up, you may trim the loose edges. Do not remove adhesive strips completely unless your health care provider tells you to do that. ? If a large square bandage is present, this may be removed 24 hours after surgery.   Check your puncture site every day for signs of infection. Check for: ? Redness, swelling, or pain. ? Fluid or blood. If your puncture site starts to bleed, lie down on your back, apply firm pressure to the area, and contact your health care provider. ? Warmth. ? Pus or a bad smell. Driving  Do not drive for at least 4 days after your procedure or however long your health care provider recommends. (Do not resume driving if you have previously been instructed not to drive for other health reasons.)  Do not drive or use heavy machinery while taking prescription pain medicine. Activity  Avoid activities that take a lot of effort for at least 7 days after your procedure.  Do not lift anything that is heavier than 5 lb (4.5 kg) for one week.   No sexual activity for 1 week.   Return to your normal activities as told by your health care provider. Ask your health care provider what activities are safe  for you. General instructions  Take over-the-counter and prescription medicines only as told by your health care provider.  Do not use any products that contain nicotine or tobacco, such as cigarettes and e-cigarettes. If you need help quitting, ask your health care provider.  You may shower after 24 hours, but Do not take baths, swim, or use a hot tub for 1 week.   Do not drink alcohol for 24 hours after your procedure.  Keep all follow-up visits as  told by your health care provider. This is important. Contact a health care provider if:  You have redness, mild swelling, or pain around your puncture site.  You have fluid or blood coming from your puncture site that stops after applying firm pressure to the area.  Your puncture site feels warm to the touch.  You have pus or a bad smell coming from your puncture site.  You have a fever.  You have chest pain or discomfort that spreads to your neck, jaw, or arm.  You are sweating a lot.  You feel nauseous.  You have a fast or irregular heartbeat.  You have shortness of breath.  You are dizzy or light-headed and feel the need to lie down.  You have pain or numbness in the arm or leg closest to your puncture site. Get help right away if:  Your puncture site suddenly swells.  Your puncture site is bleeding and the bleeding does not stop after applying firm pressure to the area. These symptoms may represent a serious problem that is an emergency. Do not wait to see if the symptoms will go away. Get medical help right away. Call your local emergency services (911 in the [place] Do not drive yourself to the hospital. Summary  After the procedure, it is normal to have bruising and tenderness at the puncture site in your groin, neck, or forearm.  Check your puncture site every day for signs of infection.  Get help right away if your puncture site is bleeding and the bleeding does not stop after applying firm pressure to the area. This is a medical emergency. This information is not intended to replace advice given to you by your health care provider. Make sure you discuss any questions you have with your health care provider.

## 2020-10-06 NOTE — H&P (Signed)
Electrophysiology Office Note:    Date:  06/25/2020   ID:  Richard Walsh, DOB [birth year], MRN [id number]  PCP:  Ginger Organ     [place] Cardiologist:  No primary care provider on file.  [facility] Electrophysiologist:  None   Referring MD: Cory Munch, PA-C   Chief Complaint: Atrial fibrillation  History of Present Illness:    Richard Walsh is a 60 y.o. male who presents for an evaluation of atrial fibrillation at the request of Collene Mares, PA-C. Their medical history includes atrial fibrillation, atrial flutter post atrial flutter ablation 2017, PVCs.  He is recently moved to the area from [place] where he was being followed by the [facility] medical [facility].  He tells me that he first was diagnosed with atrial flutter approximately 4 years ago.  He was symptomatic with those and underwent a catheter ablation on June 17, 2016.  During the procedure the CTI was ablated.  Multiple episodes of atrial fibrillation were induced during the atrial flutter ablation requiring cardioversion.  Xarelto was used periprocedurally but then discontinued 1 month after the ablation procedure.  He had a loop recorder implanted to monitor for atrial fibrillation.  He began having episodes of atrial fibrillation on the loop and these episodes have become more frequent over time.  Sotalol was used and then uptitrated from 80 mg twice daily to 120 mg twice daily.  He tells me today that the sotalol seem to help the atrial fibrillation episodes but recently he is noted his heart to be out of rhythm more frequently.  Review of his loop recorder tracings confirm this.  He tells me today that he is not very active but does move about the house in the yard doing work.  He notices no limitations in his exercise capacity.  No syncope or presyncope.  He feels his palpitations sometimes when he lays down.        Past Medical History:  Diagnosis Date  . Fatigue   .  Palpitations   . Paroxysmal atrial fibrillation (HCC)   . Ventricular premature beats          Past Surgical History:  Procedure Laterality Date  . ABLATE HEART DYSRHYTHM FOCUS    . ATRIAL FLUTTER ABLATION    . IMPLANTABLE LOOP RECORDER    . RECONSTRUCTION OF NOSE    . TONSILLECTOMY      Current Medications: Active Medications      Current Meds  Medication Sig  . ergocalciferol (VITAMIN D2) 1.25 MG (50000 UT) capsule Take 50,000 Units by mouth once a week.  . sotalol (BETAPACE) 120 MG tablet Take 120 mg by mouth in the morning and at bedtime.  . sotalol (BETAPACE) 80 MG tablet Take 80 mg by mouth 2 (two) times daily.  Marland Kitchen VITAMIN D PO Take 50,000 Units by mouth.       Allergies:   Penicillins   Social History        Socioeconomic History  . Marital status: Married    Spouse name: Not on file  . Number of children: Not on file  . Years of education: Not on file  . Highest education level: Not on file  Occupational History  . Not on file  Tobacco Use  . Smoking status: Never Smoker  . Smokeless tobacco: Never Used  Substance and Sexual Activity  . Alcohol use: Not on file  . Drug use: Not on file  . Sexual activity: Not on file  Other  Topics Concern  . Not on file  Social History Narrative  . Not on file   Social Determinants of Health      Financial Resource Strain:   . Difficulty of Paying Living Expenses: Not on file  Food Insecurity:   . Worried About Charity fundraiser in the Last Year: Not on file  . Ran Out of Food in the Last Year: Not on file  Transportation Needs:   . Lack of Transportation (Medical): Not on file  . Lack of Transportation (Non-Medical): Not on file  Physical Activity:   . Days of Exercise per Week: Not on file  . Minutes of Exercise per Session: Not on file  Stress:   . Feeling of Stress : Not on file  Social Connections:   . Frequency of Communication with Friends and Family: Not on file  .  Frequency of Social Gatherings with Friends and Family: Not on file  . Attends Religious Services: Not on file  . Active Member of Clubs or Organizations: Not on file  . Attends Archivist Meetings: Not on file  . Marital Status: Not on file     Family History: The patient's family history is not on file.  ROS:   Please see the history of present illness.    All other systems reviewed and are negative.  EKGs/Labs/Other Studies Reviewed:    The following studies were reviewed today: Prior notes from [place], EKG  EKG:  The ekg ordered today demonstrates sinus rhythm  Loop interrogation in office personally reviewed shows 2 episodes of atrial fibrillation in October, 3 in September, 2 in August, 2 in July, 3 in June, 6 in May, 12 in April.  Episodes range from a few seconds to over 6-1/2 hours with ventricular rates.  Review of the tracings shows what appears to be either coarse atrial fibrillation or atrial flutter  Recent Labs: No results found for requested labs within last 8760 hours.  Recent Lipid Panel Labs (Brief)  No results found for: CHOL, TRIG, HDL, CHOLHDL, VLDL, LDLCALC, LDLDIRECT    Physical Exam:    VS:  BP 100/64   Pulse 63   Ht 5' 11.5" (1.816 m)   Wt 153 lb 9.6 oz (69.7 kg)   SpO2 97%   BMI 21.12 kg/m        Wt Readings from Last 3 Encounters:  06/25/20 153 lb 9.6 oz (69.7 kg)     GEN:  Well nourished, well developed in no acute distress HEENT: Normal NECK: No JVD; No carotid bruits LYMPHATICS: No lymphadenopathy CARDIAC: RRR, no murmurs, rubs, gallops RESPIRATORY:  Clear to auscultation without rales, wheezing or rhonchi  ABDOMEN: Soft, non-tender, non-distended MUSCULOSKELETAL:  No edema; No deformity  SKIN: Warm and dry NEUROLOGIC:  Alert and oriented x 3 PSYCHIATRIC:  Normal affect   ASSESSMENT:    1. Paroxysmal atrial fibrillation (HCC)    PLAN:    In order of problems listed above:  1. Paroxysmal atrial  fibrillation and flutter Post atrial flutter ablation in 2017.  Patient continues to have (mildly) symptomatic episodes of atrial fibrillation and possibly atrial flutter on his loop recorder.  He is not currently anticoagulated given a CHA2DS2-VASc of 0.  He is interested in definitive management for his arrhythmias given the fear that these will progress as he ages and become more symptomatic.  He understands the pathophysiology of atrial fibrillation and flutter and the fact that these will likely become more frequent and longer in  duration.  We discussed the options for managing his atrial fibrillation including #1 continued watchful waiting and monitoring of his loop recorder to detect a change in frequency of the atrial arrhythmias, #2 antiarrhythmic therapy and #3 ablation therapy.  Antiarrhythmic options for Richard Walsh include class Ic's, dronedarone, amiodarone, and Tikosyn.  It is concerning to me that despite being on a good dose of sotalol he continues to have episodes of atrial arrhythmias.  Given that fact, I would not use class Ic 0 dronedarone.  Given his young age, amiodarone is not a good option.  That leaves Tikosyn.  After discussing the use of Tikosyn with the patient,'s pacifically the requirement that this medicine be started as an inpatient, the patient is understandably hesitant to pursue this option.  I discussed ablation at length during today's visit with the patient and his wife including the risks, benefits, expected success rates and potential need for future procedures or antiarrhythmic therapy.  He plans to think about it after leaving the office today and will let us know if he would like to schedule.  We provided him with several procedure dates in the coming months.  If he would like to schedule, we will need to start him on anticoagulation (Xarelto) for at least 3 weeks prior to the procedure.  This will be continued for at least 3 months after the procedure.  We will also  need a CT scan to assess his cardiac anatomy especially in the presence of his pectus excavatum.  Risk, benefits, and alternatives to EP study and radiofrequency ablation for afib were also discussed in detail today. These risks include but are not limited to stroke, bleeding, vascular damage, tamponade, perforation, damage to the esophagus, lungs, and other structures, pulmonary vein stenosis, worsening renal function, and death. The patient understands these risk.  Carto, ICE, anesthesia are requested for the procedure.  Will also obtain CT PV protocol prior to the procedure to exclude LAA thrombus and further evaluate atrial anatomy.    ---------------------------------------------------------------------------------  I have seen, examined the patient, and reviewed the above assessment and plan.    Plan for PVI today. Will also plan to check CTI to confirm still blocked. No missed doses of Xarelto.  Vickie Epley, MD 10/06/2020 7:20 AM

## 2020-10-06 NOTE — Progress Notes (Signed)
Pt ambulated without difficulty or bleeding.   Discharged home with wife  who will drive and stay with pt x 24 hrs. Waiting wife's arrival

## 2020-10-06 NOTE — Anesthesia Postprocedure Evaluation (Signed)
Anesthesia Post Note  Patient: Richard Walsh  Procedure(s) Performed: ATRIAL FIBRILLATION ABLATION (N/A )     Patient location during evaluation: PACU Anesthesia Type: General Level of consciousness: awake and alert Pain management: pain level controlled Vital Signs Assessment: post-procedure vital signs reviewed and stable Respiratory status: spontaneous breathing, nonlabored ventilation and respiratory function stable Cardiovascular status: blood pressure returned to baseline and stable Postop Assessment: no apparent nausea or vomiting Anesthetic complications: no   No complications documented.  Last Vitals:  Vitals:   10/06/20 1130 10/06/20 1200  BP: 119/65 114/66  Pulse: 78 79  Resp: 15 17  Temp:    SpO2: 98% 98%    Last Pain:  Vitals:   10/06/20 1105  TempSrc:   PainSc: 0-No pain                 Merlinda Frederick

## 2020-10-31 ENCOUNTER — Other Ambulatory Visit: Payer: Self-pay | Admitting: Cardiology

## 2020-11-03 ENCOUNTER — Other Ambulatory Visit: Payer: Self-pay

## 2020-11-03 ENCOUNTER — Encounter (HOSPITAL_COMMUNITY): Payer: Self-pay | Admitting: Nurse Practitioner

## 2020-11-03 ENCOUNTER — Ambulatory Visit (HOSPITAL_COMMUNITY)
Admission: RE | Admit: 2020-11-03 | Discharge: 2020-11-03 | Disposition: A | Discharge status: Home / Self Care | Payer: 59 | Source: Ambulatory Visit | Attending: Nurse Practitioner | Admitting: Nurse Practitioner

## 2020-11-03 VITALS — BP 128/74 | HR 75 | Ht 71.5 in | Wt 157.2 lb

## 2020-11-03 DIAGNOSIS — D6869 Other thrombophilia: Secondary | ICD-10-CM | POA: Diagnosis not present

## 2020-11-03 DIAGNOSIS — Z880 Allergy status to penicillin: Secondary | ICD-10-CM | POA: Diagnosis not present

## 2020-11-03 DIAGNOSIS — I4891 Unspecified atrial fibrillation: Secondary | ICD-10-CM | POA: Diagnosis not present

## 2020-11-03 DIAGNOSIS — Z79899 Other long term (current) drug therapy: Secondary | ICD-10-CM | POA: Diagnosis not present

## 2020-11-03 DIAGNOSIS — Z7901 Long term (current) use of anticoagulants: Secondary | ICD-10-CM | POA: Insufficient documentation

## 2020-11-03 LAB — MAGNESIUM: Magnesium: 2.3 mg/dL (ref 1.7–2.4)

## 2020-11-03 NOTE — Progress Notes (Signed)
Primary Care Physician: Cory Munch, PA-C Referring Physician:Dr. Mahamadou Weltz is a 60 y.o. male with a h/o remote typical  atrial flutter ablation, in the [place] area,  PVC's,  that moved to this area from [place] in the fall of 2021. He had a afib ablation 10/07/19 by Dr. Quentin Ore for the onset of atrial fibrillation. Today, he reports that he has maintained SR. No groin or swallowing issues.Back to usual atcivities. Has not noted any sustained afib.  PVC's on EKG but on auscultation, regular rhythm. He remains on sotalol and and xarelto for a CHA2DS2VASc score of 0.   Today, he denies symptoms of palpitations, chest pain, shortness of breath, orthopnea, PND, lower extremity edema, dizziness, presyncope, syncope, or neurologic sequela. The patient is tolerating medications without difficulties and is otherwise without complaint today.   Past Medical History:  Diagnosis Date  . Fatigue   . Palpitations   . Paroxysmal atrial fibrillation (HCC)   . Ventricular premature beats    Past Surgical History:  Procedure Laterality Date  . ABLATE HEART DYSRHYTHM FOCUS    . ATRIAL FIBRILLATION ABLATION N/A 10/06/2020   Procedure: ATRIAL FIBRILLATION ABLATION;  Surgeon: Vickie Epley, MD;  Location: [facility] [place];  Service: Cardiovascular;  Laterality: N/A;  . ATRIAL FLUTTER ABLATION    . IMPLANTABLE LOOP RECORDER    . RECONSTRUCTION OF NOSE    . TONSILLECTOMY      Current Outpatient Medications  Medication Sig Dispense Refill  . pantoprazole (PROTONIX) 40 MG tablet TAKE 1 TABLET BY MOUTH EVERY DAY 90 tablet 1  . rivaroxaban (XARELTO) 20 MG TABS tablet Take 1 tablet (20 mg total) by mouth daily with supper. 30 tablet 11  . sotalol (BETAPACE) 120 MG tablet Take 1 tablet (120 mg total) by mouth in the morning and at bedtime. 180 tablet 3  . colchicine 0.6 MG tablet Take 1 tablet (0.6 mg total) by mouth 2 (two) times daily for 5 days. 10 tablet 0   No current  facility-administered medications for this encounter.    Allergies  Allergen Reactions  . Penicillins Hives    Reaction: 10 years    Social History   Socioeconomic History  . Marital status: Married    Spouse name: Not on file  . Number of children: Not on file  . Years of education: Not on file  . Highest education level: Not on file  Occupational History  . Not on file  Tobacco Use  . Smoking status: Never Smoker  . Smokeless tobacco: Never Used  Substance and Sexual Activity  . Alcohol use: Never  . Drug use: Never  . Sexual activity: Not on file  Other Topics Concern  . Not on file  Social History Narrative  . Not on file   Social Determinants of Health   Financial Resource Strain: Not on file  Food Insecurity: Not on file  Transportation Needs: Not on file  Physical Activity: Not on file  Stress: Not on file  Social Connections: Not on file  Intimate Partner Violence: Not on file    No family history on file.  ROS- All systems are reviewed and negative except as per the HPI above  Physical Exam: Vitals:   11/03/20 0833  BP: 128/74  Pulse: 75  Weight: 71.3 kg  Height: 5' 11.5" (1.816 m)   Wt Readings from Last 3 Encounters:  11/03/20 71.3 kg  10/06/20 74.8 kg  06/25/20 69.7 kg  Labs: Lab Results  Component Value Date   NA 140 10/06/2020   K 4.1 10/06/2020   CL 104 10/06/2020   CO2 27 10/06/2020   GLUCOSE 90 10/06/2020   BUN 15 10/06/2020   CREATININE 0.86 10/06/2020   CALCIUM 9.2 10/06/2020   No results found for: INR No results found for: CHOL, HDL, LDLCALC, TRIG   GEN- The patient is well appearing, alert and oriented x 3 today.   Head- normocephalic, atraumatic Eyes-  Sclera clear, conjunctiva pink Ears- hearing intact Oropharynx- clear Neck- supple, no JVP Lymph- no cervical lymphadenopathy Lungs- Clear to ausculation bilaterally, normal work of breathing Heart- Regular rate and rhythm, no murmurs, rubs or gallops, PMI not  laterally displaced GI- soft, NT, ND, + BS Extremities- no clubbing, cyanosis, or edema MS- no significant deformity or atrophy Skin- no rash or lesion Psych- euthymic mood, full affect Neuro- strength and sensation are intact  EKG-SR at 75 bpm, PVC's  pr int 188 ms, qrs int 94 ms, qtc 469 ms  Epic records reviewed   Assessment and Plan: 1. Afib S/p ablation one month ago  Recent K+ in range at 4.1, but will draw magnesium for being on sotalol  Continue sotalol at 120 mg bid, qt stable   2. CHA2DS2VASc score of 0 Continue xarelto 20 mg daily  Reminded not to stop any anticoagulation for the next 2 months  F/u with Dr. Quentin Ore 5/17    Butch Penny C. Mae Denunzio, [facility] [address] [place], [place] [address] [phone]

## 2021-01-05 NOTE — Progress Notes (Signed)
Electrophysiology Office Follow up Visit Note:    Date:  01/06/2021   ID:  Particia Lather, DOB [birth year], MRN [id number]  PCP:  Ginger Organ  [facility] Cardiologist:  None  [facility] Electrophysiologist:  Vickie Epley, MD    Interval History:    Richard Walsh is a 60 y.o. male who presents for a follow up visit.  He underwent a successful atrial fibrillation ablation on October 06, 2020.  He was last seen in the A. fib clinic on November 03, 2020.  He is maintained on sotalol 120 mg by mouth twice daily and Xarelto for stroke prophylaxis.  He has a history of a prior CTI ablation.  During my ablation with him on February 14, the patient's CTI was found to still be blocked.  Patient has been doing well since his ablation.  No sustained episodes of arrhythmias.  He is tolerating the Xarelto and sotalol.  He has several trips planned in the fall to [place] and out [place].  His son still lives in [place] in the [place] area.   Past Medical History:  Diagnosis Date  . Fatigue   . Palpitations   . Paroxysmal atrial fibrillation (HCC)   . Ventricular premature beats     Past Surgical History:  Procedure Laterality Date  . ABLATE HEART DYSRHYTHM FOCUS    . ATRIAL FIBRILLATION ABLATION N/A 10/06/2020   Procedure: ATRIAL FIBRILLATION ABLATION;  Surgeon: Vickie Epley, MD;  Location: [facility];  Service: Cardiovascular;  Laterality: N/A;  . ATRIAL FLUTTER ABLATION    . IMPLANTABLE LOOP RECORDER    . RECONSTRUCTION OF NOSE    . TONSILLECTOMY      Current Medications: No outpatient medications have been marked as taking for the 01/06/21 encounter (Office Visit) with Vickie Epley, MD.     Allergies:   Penicillins   Social History   Socioeconomic History  . Marital status: Married    Spouse name: Not on file  . Number of children: Not on file  . Years of education: Not on file  . Highest education level: Not on file  Occupational History  .  Not on file  Tobacco Use  . Smoking status: Never Smoker  . Smokeless tobacco: Never Used  Substance and Sexual Activity  . Alcohol use: Never  . Drug use: Never  . Sexual activity: Not on file  Other Topics Concern  . Not on file  Social History Narrative  . Not on file   Social Determinants of Health   Financial Resource Strain: Not on file  Food Insecurity: Not on file  Transportation Needs: Not on file  Physical Activity: Not on file  Stress: Not on file  Social Connections: Not on file     Family History: The patient's family history is not on file.  ROS:   Please see the history of present illness.    All other systems reviewed and are negative.  EKGs/Labs/Other Studies Reviewed:    The following studies were reviewed today:   EKG:  The ekg ordered today demonstrates sinus rhythm.  QTc 449 ms.  Recent Labs: 10/06/2020: BUN 15; Creatinine, Ser 0.86; Hemoglobin 15.1; Platelets 214; Potassium 4.1; Sodium 140 11/03/2020: Magnesium 2.3  Recent Lipid Panel No results found for: CHOL, TRIG, HDL, CHOLHDL, VLDL, LDLCALC, LDLDIRECT  Physical Exam:    VS:  BP 102/62   Pulse 73   Ht 6' (1.829 m)   Wt 157 lb 6.4 oz (71.4 kg)  SpO2 98%   BMI 21.35 kg/m     Wt Readings from Last 3 Encounters:  01/06/21 157 lb 6.4 oz (71.4 kg)  11/03/20 157 lb 3.2 oz (71.3 kg)  10/06/20 165 lb (74.8 kg)     GEN:  Well nourished, well developed in no acute distress HEENT: Normal NECK: No JVD; No carotid bruits LYMPHATICS: No lymphadenopathy CARDIAC: RRR, no murmurs, rubs, gallops RESPIRATORY:  Clear to auscultation without rales, wheezing or rhonchi  ABDOMEN: Soft, non-tender, non-distended MUSCULOSKELETAL:  No edema; No deformity  SKIN: Warm and dry NEUROLOGIC:  Alert and oriented x 3 PSYCHIATRIC:  Normal affect   ASSESSMENT:    1. Paroxysmal atrial fibrillation (HCC)   2. Atrial flutter, unspecified type ([place])   3. Encounter for long-term (current) use of high-risk  medication    PLAN:    In order of problems listed above:  1. Paroxysmal atrial fibrillation Patient maintaining sinus rhythm after his ablation.  Still on sotalol and Xarelto for stroke prophylaxis.  He is interested in stopping both of these medications but is okay to wait another few months.  I will plan to see him back in about 6 months to see how he is doing.  If he is maintaining normal rhythm at that point, favor discontinuing sotalol and Xarelto.  We discussed strategies for on going rhythm monitoring including replacing his loop monitor, using the cardia device versus an apple watch.  2.  High risk medication monitoring QTc appropriate for ongoing sotalol use.  Continue sotalol 120 mg by mouth twice daily.  Follow-up 6 months     Medication Adjustments/Labs and Tests Ordered: Current medicines are reviewed at length with the patient today.  Concerns regarding medicines are outlined above.  Orders Placed This Encounter  Procedures  . EKG 12-Lead   No orders of the defined types were placed in this encounter.    Signed, Lars Mage, MD, [facility], [facility] 01/06/2021 8:30 AM    Electrophysiology [facility]

## 2021-01-06 ENCOUNTER — Other Ambulatory Visit: Payer: Self-pay

## 2021-01-06 ENCOUNTER — Encounter: Payer: Self-pay | Admitting: Cardiology

## 2021-01-06 ENCOUNTER — Ambulatory Visit (INDEPENDENT_AMBULATORY_CARE_PROVIDER_SITE_OTHER): Payer: 59 | Admitting: Cardiology

## 2021-01-06 VITALS — BP 102/62 | HR 73 | Ht 72.0 in | Wt 157.4 lb

## 2021-01-06 DIAGNOSIS — I480 Paroxysmal atrial fibrillation: Secondary | ICD-10-CM

## 2021-01-06 DIAGNOSIS — Z79899 Other long term (current) drug therapy: Secondary | ICD-10-CM

## 2021-01-06 DIAGNOSIS — I4892 Unspecified atrial flutter: Secondary | ICD-10-CM | POA: Diagnosis not present

## 2021-01-06 NOTE — Patient Instructions (Addendum)
Medication Instructions:  Your physician recommends that you continue on your current medications as directed. Please refer to the Current Medication list given to you today.  Labwork: None ordered.  Testing/Procedures: None ordered.  Follow-Up: Your physician wants you to follow-up in: 6 months with Dr. Quentin Ore.   Any Other Special Instructions Will Be Listed Below (If Applicable).  If you need a refill on your cardiac medications before your next appointment, please call your pharmacy.

## 2021-02-02 ENCOUNTER — Telehealth: Payer: Self-pay | Admitting: Cardiology

## 2021-02-02 NOTE — Telephone Encounter (Signed)
Pt c/o medication issue:  1. Name of Medication: rivaroxaban (XARELTO) 20 MG TABS tablet  2. How are you currently taking this medication (dosage and times per day)? As prescribed   3. Are you having a reaction (difficulty breathing--STAT)? No   4. What is your medication issue? Richard Walsh is calling requesting another PA be submitted on this medication for [employer]. He states the previous one submitted was through his previous insurance. Please advise.

## 2021-02-03 NOTE — Telephone Encounter (Signed)
**Note De-Identified Denelda Akerley Obfuscation** I started a [place] PA throuh covermymeds and received the following message:  Olive Motyka Key: [id number] - PA Case ID: [id number] Outcome: This request has been approved using information available on the patient's profile. Coverage Start Date:01/04/2021;Coverage End Date:02/03/2022; Drug: Xarelto 20MG  tablets Form [employer] Electronic PA Form (2017 NCPDP)  I have notified [facility] of this approval.

## 2021-06-09 IMAGING — CT CT HEART MORPH/PULM VEIN W/ CM & W/O CA SCORE
2 of 7 series · 11 of 20 positions shown, 13 images · IV contrast (Omni 300)
Comparison: None
COMPARISON: None.
COMPARISON: None

Addendum:
CLINICAL DATA: Pre Ablation

EXAM:
Cardiac Gated CTA
TECHNIQUE: The patient was scanned on a Siemens Force [REDACTED]ice scanner. Gantry
rotation speed was 250 msec with a temporal resolution of 66 msec. A
prospective scan was triggered in the ascending thoracic aorta at
140 HU's Data sets were reconstructed with full mA between 35% and
75% of the R-R interval Images were reviewed using VRT, MIP and MPR
modes. Double oblique images were used to measure the PV diameter
and areas. The patient received 80 cc of contrast at 5 cc/sec
CONTRAST:  Isovue 370 total 80 cc

[Series 11: 0-95% · axial · 0.40mm/px · z∈[+1117,+1203]mm · 5 of 2580 slices shown]
[im 430/2580  vessel]
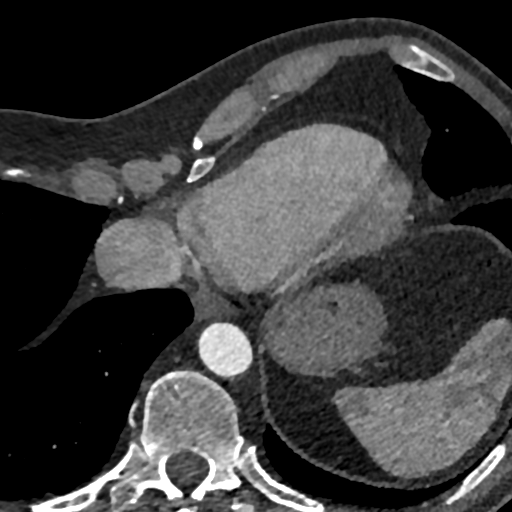
[im 860/2580  vessel]
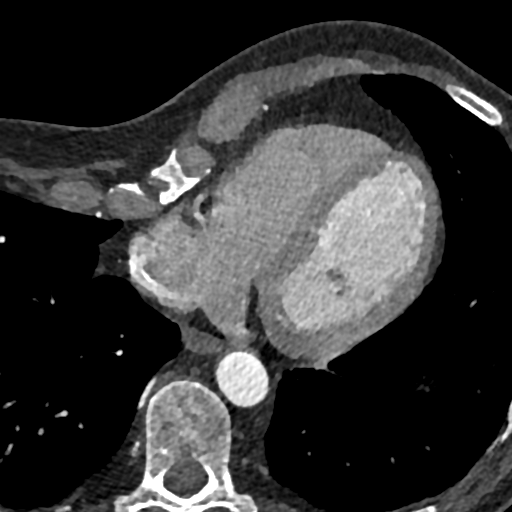
[im 1290/2580  vessel]
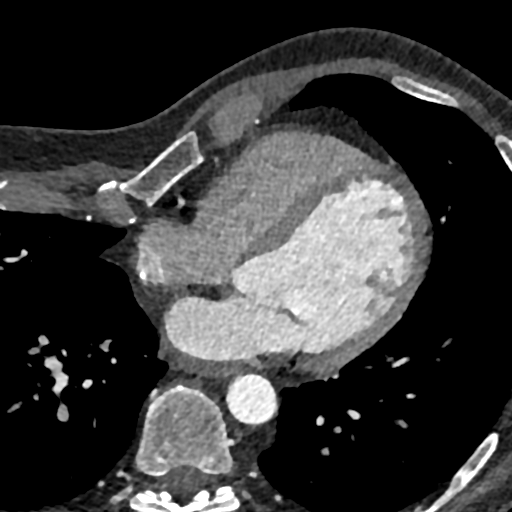
[im 1720/2580  vessel]
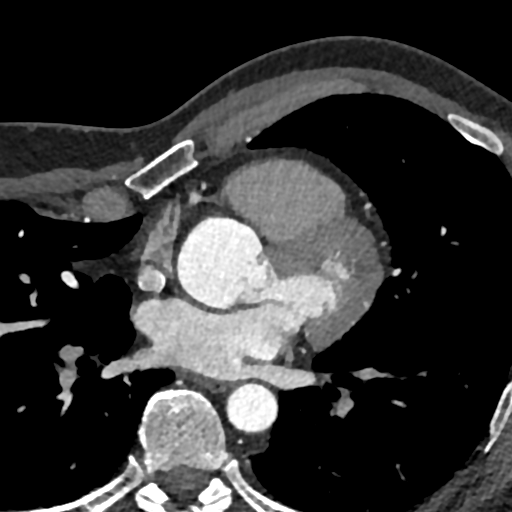
[im 2150/2580  vessel]
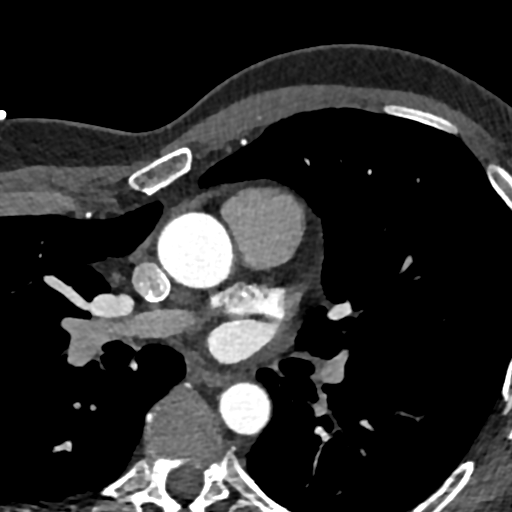

[Series 14: 5-95% · axial · 0.75mm/px · z∈[+1114,+1206]mm · 6 of 2580 slices shown, 8 images]
[im 369/2580  vessel]
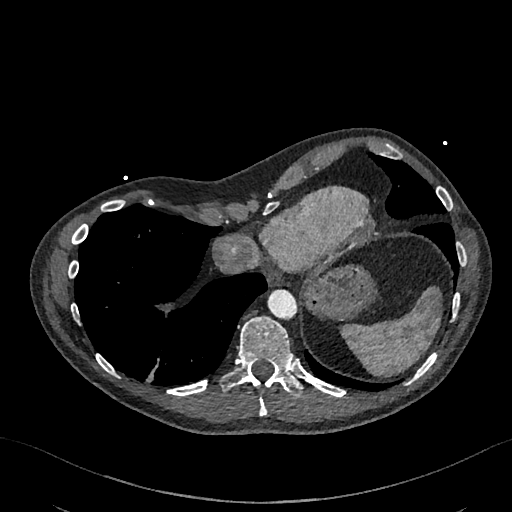
[im 369/2580  lung]
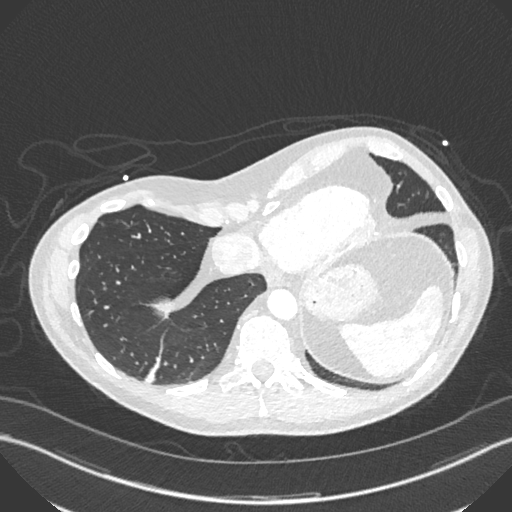
[im 737/2580  vessel]
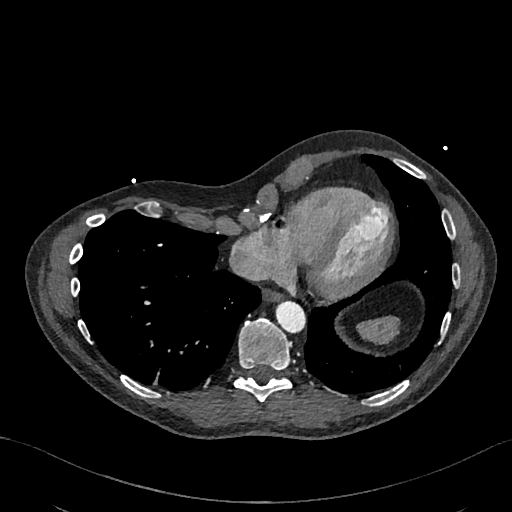
[im 1106/2580  vessel]
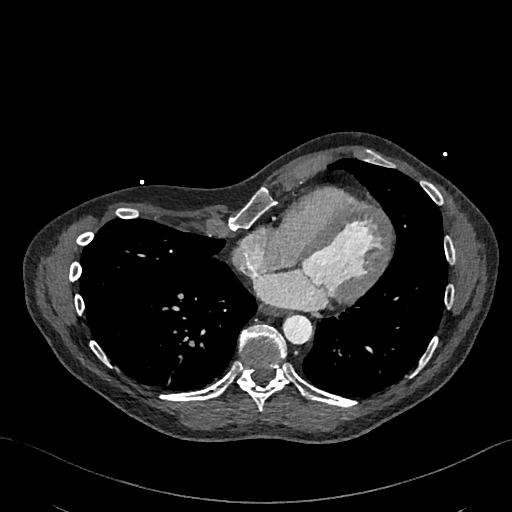
[im 1474/2580  vessel]
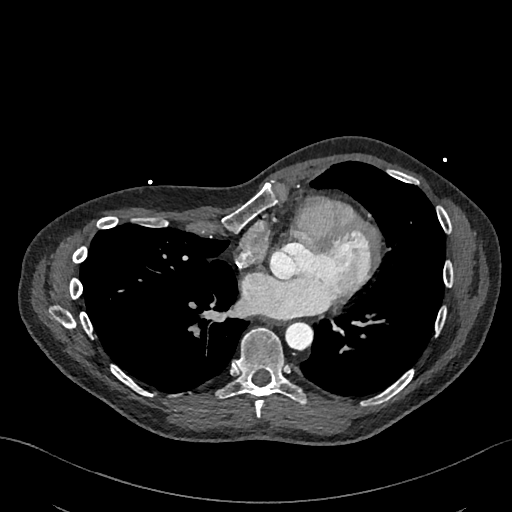
[im 1843/2580  vessel]
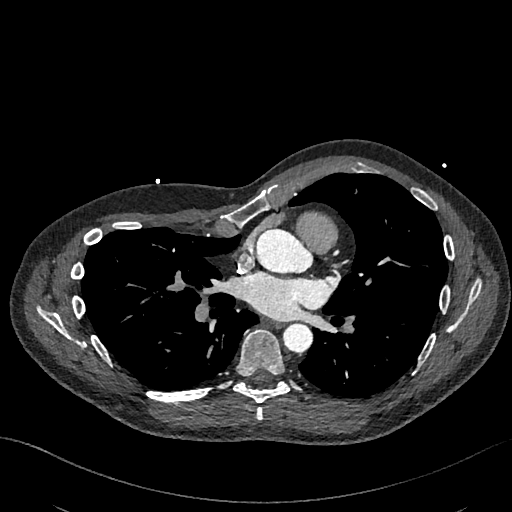
[im 1843/2580  lung]
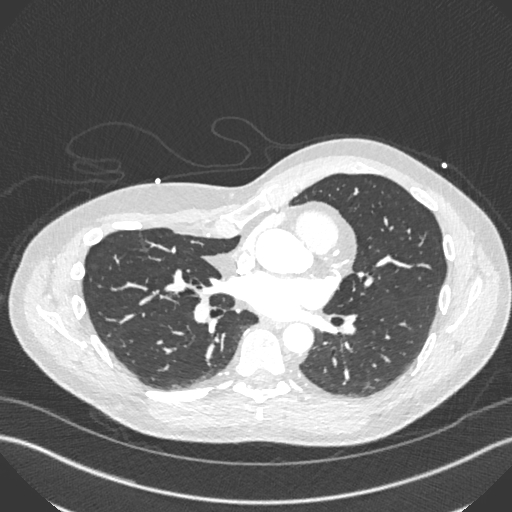
[im 2211/2580  vessel]
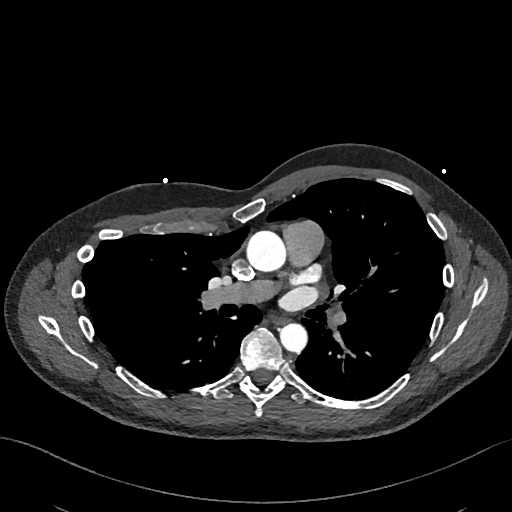

[11 of 20 positions shown; findings below may reference images not displayed]

FINDINGS: Normal right dominant coronary arteries Calcium score 0. Normal
aortic root 3.0 cm. No pericardial effusion No ASD/PFO/VSD. Normal
atrial sizes No EMILIJANO thrombus

Normal PV anatomy with no anomaly dimensions below

LUPV:  Ostium 16.8 mm    area 2.3 cm2

LLPV:   Ostium 11 mm   area 0.85 cm2

RUPV:  Ostium 16 mm   area 2.2 cm2

RLPV:  Ostium 15.8 mm   area 1.8 cm2
IMPRESSION: 1.  Normal right dominant coronary arteries Calcium score 0

2.  Normal atrial sizes no EMILIJANO thrombus

3. Normal PV anatomy with no anomaly dimensions above The LLPV is
quite diminutive

4.  Normal aortic root 3.0 cm

5.  No pericardial effusion

6.  No ASD/PFO

ADDENDUM:
OVER-READ INTERPRETATION  CT CHEST

The following report is an over-read performed by radiologist Dr.
does not include interpretation of cardiac or coronary anatomy or
pathology. The coronary CTA interpretation by the cardiologist is
attached.
FINDINGS: No pleural fluid.  Right base scarring.

Normal ascending aortic caliber. Tortuous thoracic aorta. No central
pulmonary embolism, on this non-dedicated study.

No imaged thoracic adenopathy.

Hepatic calcification within the dome is likely post infectious.
Normal imaged portions of the spleen, stomach.

Moderate pectus excavatum deformity.
IMPRESSION: No acute findings in the imaged extracardiac chest.

*** End of Addendum ***
FINDINGS: Normal right dominant coronary arteries Calcium score 0. Normal
aortic root 3.0 cm. No pericardial effusion No ASD/PFO/VSD. Normal
atrial sizes No EMILIJANO thrombus

Normal PV anatomy with no anomaly dimensions below

LUPV:  Ostium 16.8 mm    area 2.3 cm2

LLPV:   Ostium 11 mm   area 0.85 cm2

RUPV:  Ostium 16 mm   area 2.2 cm2

RLPV:  Ostium 15.8 mm   area 1.8 cm2
IMPRESSION: 1.  Normal right dominant coronary arteries Calcium score 0

2.  Normal atrial sizes no EMILIJANO thrombus

3. Normal PV anatomy with no anomaly dimensions above The LLPV is
quite diminutive

4.  Normal aortic root 3.0 cm

5.  No pericardial effusion

6.  No ASD/PFO

## 2021-06-22 ENCOUNTER — Other Ambulatory Visit: Payer: Self-pay | Admitting: *Deleted

## 2021-06-22 MED ORDER — SOTALOL HCL 120 MG PO TABS: 120.0000 mg | ORAL_TABLET | Freq: Two times a day (BID) | ORAL | 3 refills | Status: DC

## 2021-07-13 NOTE — Progress Notes (Signed)
Electrophysiology Office Follow up Visit Note:    Date:  07/14/2021   ID:  Richard Walsh, DOB 1961/05/20, MRN [id number]  PCP:  Ginger Organ  [facility] Cardiologist:  None  [facility] Electrophysiologist:  Vickie Epley, MD    Interval History:    Richard Walsh is a 60 y.o. male who presents for a follow up visit.  He underwent a pulmonary vein isolation procedure on October 06, 2020.  I saw him in follow-up on Jan 06, 2021.  We planned to meet back 6 months after her last appointment to discuss stopping sotalol and Xarelto. Today he tells me he has been doing well without recurrence of his arrhythmia.      Past Medical History:  Diagnosis Date   Fatigue    Palpitations    Paroxysmal atrial fibrillation (HCC)    Ventricular premature beats     Past Surgical History:  Procedure Laterality Date   ABLATE HEART DYSRHYTHM FOCUS     ATRIAL FIBRILLATION ABLATION N/A 10/06/2020   Procedure: ATRIAL FIBRILLATION ABLATION;  Surgeon: Vickie Epley, MD;  Location: [facility];  Service: Cardiovascular;  Laterality: N/A;   ATRIAL FLUTTER ABLATION     IMPLANTABLE LOOP RECORDER     RECONSTRUCTION OF NOSE     TONSILLECTOMY      Current Medications: Current Meds  Medication Sig   [DISCONTINUED] rivaroxaban (XARELTO) 20 MG TABS tablet Take 1 tablet (20 mg total) by mouth daily with supper.   [DISCONTINUED] sotalol (BETAPACE) 120 MG tablet Take 1 tablet (120 mg total) by mouth in the morning and at bedtime.     Allergies:   Penicillins   Social History   Socioeconomic History   Marital status: Married    Spouse name: Not on file   Number of children: Not on file   Years of education: Not on file   Highest education level: Not on file  Occupational History   Not on file  Tobacco Use   Smoking status: Never   Smokeless tobacco: Never  Substance and Sexual Activity   Alcohol use: Never   Drug use: Never   Sexual activity: Not on file   Other Topics Concern   Not on file  Social History Narrative   Not on file   Social Determinants of Health   Financial Resource Strain: Not on file  Food Insecurity: Not on file  Transportation Needs: Not on file  Physical Activity: Not on file  Stress: Not on file  Social Connections: Not on file     Family History: The patient's family history is not on file.  ROS:   Please see the history of present illness.    All other systems reviewed and are negative.  EKGs/Labs/Other Studies Reviewed:    The following studies were reviewed today:   EKG:  The ekg ordered today demonstrates sinus rhythm.  Recent Labs: 10/06/2020: BUN 15; Creatinine, Ser 0.86; Hemoglobin 15.1; Platelets 214; Potassium 4.1; Sodium 140 11/03/2020: Magnesium 2.3  Recent Lipid Panel No results found for: CHOL, TRIG, HDL, CHOLHDL, VLDL, LDLCALC, LDLDIRECT  Physical Exam:    VS:  BP 126/80   Pulse 82   Ht 6' (1.829 m)   Wt 162 lb 6.4 oz (73.7 kg)   SpO2 97%   BMI 22.03 kg/m     Wt Readings from Last 3 Encounters:  07/14/21 162 lb 6.4 oz (73.7 kg)  01/06/21 157 lb 6.4 oz (71.4 kg)  11/03/20 157 lb 3.2  oz (71.3 kg)     GEN:  Well nourished, well developed in no acute distress HEENT: Normal NECK: No JVD; No carotid bruits LYMPHATICS: No lymphadenopathy CARDIAC: RRR, no murmurs, rubs, gallops RESPIRATORY:  Clear to auscultation without rales, wheezing or rhonchi  ABDOMEN: Soft, non-tender, non-distended MUSCULOSKELETAL:  No edema; No deformity  SKIN: Warm and dry NEUROLOGIC:  Alert and oriented x 3 PSYCHIATRIC:  Normal affect        ASSESSMENT:    1. Paroxysmal atrial fibrillation (HCC)   2. Encounter for long-term (current) use of high-risk medication    PLAN:    In order of problems listed above:  #Paroxysmal atrial fibrillation Maintaining sinus rhythm after his ablation.  He has a CHA2DS2-VASc of 0.  He is interested in stopping his sotalol.  We had a discussion about  stopping sotalol and his Xarelto during today's visit.  He would like to stop both.  I have encouraged him to consider heart monitoring at home 1-2 times per week using an alive core [facility] device or apple watch/Samsung watch.  He also has a history of a loop recorder implant that is now nonfunctional.  We discussed removing and replacing this.  He would like to consider the [facility] device which I think a good idea.  We will plan to see him back in 1 year with an APP or sooner as needed.    Medication Adjustments/Labs and Tests Ordered: Current medicines are reviewed at length with the patient today.  Concerns regarding medicines are outlined above.  Orders Placed This Encounter  Procedures   EKG 12-Lead    No orders of the defined types were placed in this encounter.    Signed, Lars Mage, MD, [facility], [facility] 07/14/2021 8:30 AM    Electrophysiology [facility]

## 2021-07-14 ENCOUNTER — Ambulatory Visit (INDEPENDENT_AMBULATORY_CARE_PROVIDER_SITE_OTHER): Payer: 59 | Admitting: Cardiology

## 2021-07-14 ENCOUNTER — Encounter: Payer: Self-pay | Admitting: Cardiology

## 2021-07-14 ENCOUNTER — Other Ambulatory Visit: Payer: Self-pay

## 2021-07-14 VITALS — BP 126/80 | HR 82 | Ht 72.0 in | Wt 162.4 lb

## 2021-07-14 DIAGNOSIS — Z79899 Other long term (current) drug therapy: Secondary | ICD-10-CM | POA: Diagnosis not present

## 2021-07-14 DIAGNOSIS — I480 Paroxysmal atrial fibrillation: Secondary | ICD-10-CM | POA: Diagnosis not present

## 2021-07-14 NOTE — Patient Instructions (Addendum)
Medication Instructions:  Your physician has recommended you make the following change in your medication:    STOP sotalol  2.   STOP Xarelto   Alivecor Kardia monitor  Lab Work: None ordered. If you have labs (blood work) drawn today and your tests are completely normal, you will receive your results only by: [place] (if you have MyChart) OR A paper copy in the mail If you have any lab test that is abnormal or we need to change your treatment, we will call you to review the results.  Testing/Procedures: None ordered.  Follow-Up: At [facility], you and your health needs are our priority.  As part of our continuing mission to provide you with exceptional heart care, we have created designated Provider Care Teams.  These Care Teams include your primary Cardiologist (physician) and Advanced Practice Providers (APPs -  Physician Assistants and Nurse Practitioners) who all work together to provide you with the care you need, when you need it.  Your next appointment:   Your physician wants you to follow-up in: one year with one of the following Advanced Practice Providers on your designated Care Team:   Tommye Standard, PA-C Legrand Como "Jonni Sanger" [place], [place] You will receive a reminder letter in the mail two months in advance. If you don't receive a letter, please call our office to schedule the follow-up appointment.

## 2021-07-20 ENCOUNTER — Telehealth: Payer: Self-pay | Admitting: Cardiology

## 2021-07-20 NOTE — Telephone Encounter (Signed)
Returned call to Pt.  Advised he should NOT take sotalol for breakthrough afib.  Advised because it had been discontinued it is not safe to take without hospitalization.  Pt indicates understanding.  Discussed that he may have breakthrough afib from time to time.  Discussed role stress plays in breakthrough afib.  Pt states this last week has been very stressful for him.    Advised if any further breakthrough to call office and will get Pt in to see APP.  Pt indicates understanding and thanked nurse for call back.

## 2021-07-20 NOTE — Telephone Encounter (Signed)
Patient c/o Palpitations:  High priority if patient c/o lightheadedness, shortness of breath, or chest pain  How long have you had palpitations/irregular HR/ Afib? Symptoms lasted about half an hour   Are you having the symptoms now? no  Are you currently experiencing lightheadedness, SOB or CP? No/   Do you have a history of afib (atrial fibrillation) or irregular heart rhythm? Yes. Patient had first spell since ablation   Have you checked your BP or HR? (document readings if available):   Are you experiencing any other symptoms?none now, but patient did have lightheadedness, dizziness, and  slight headache during afib spell    Pt c/o medication issue:  1. Name of Medication: sotalol (BETAPACE) 120 MG tablet   2. How are you currently taking this medication (dosage and times per day)? Patient took one dose last night and one dose this morning  3. Are you having a reaction (difficulty breathing--STAT)? no  4. What is your medication issue? Patient took his last dose Tuesday morning. Patient was told that based on his most recent echo he could stop taking the sotalol. He had some afib and took some and it helped his symptoms. The patient is not sure what to do now

## 2022-01-31 ENCOUNTER — Ambulatory Visit
Admission: EM | Admit: 2022-01-31 | Discharge: 2022-01-31 | Disposition: A | Discharge status: Home / Self Care | Payer: 59 | Attending: Nurse Practitioner | Admitting: Nurse Practitioner

## 2022-01-31 DIAGNOSIS — H6982 Other specified disorders of Eustachian tube, left ear: Secondary | ICD-10-CM | POA: Diagnosis not present

## 2022-01-31 MED ORDER — CETIRIZINE-PSEUDOEPHEDRINE ER 5-120 MG PO TB12: 1.0000 | ORAL_TABLET | Freq: Two times a day (BID) | ORAL | 0 refills | Status: AC

## 2022-01-31 MED ORDER — FLUTICASONE PROPIONATE 50 MCG/ACT NA SUSP: 2.0000 | Freq: Every day | NASAL | 0 refills | Status: DC

## 2022-01-31 NOTE — ED Triage Notes (Signed)
Pt reports left ear pain x 2 days. Pt has not taken meds for the complaint.

## 2022-01-31 NOTE — ED Provider Notes (Signed)
[facility]    CSN: [id number] Arrival date & time: 01/31/22  0935      History   Chief Complaint Chief Complaint  Patient presents with   Otalgia    HPI Richard Walsh is a 60 y.o. male.    Otalgia  Patient is a 61 year old male who presents with his spouse for complaints of left ear pain for 2 days.  He denies headache, fever, chills, cough, congestion, runny nose, or GI symptoms.  Patient denies any history of seasonal allergies.  He has not taken any medication for symptoms.  Past Medical History:  Diagnosis Date   Fatigue    Palpitations    Paroxysmal atrial fibrillation [facility])    Ventricular premature beats     Patient Active Problem List   Diagnosis Date Noted   Paroxysmal atrial fibrillation ([place]) 06/25/2020    Past Surgical History:  Procedure Laterality Date   ABLATE HEART DYSRHYTHM FOCUS     ATRIAL FIBRILLATION ABLATION N/A 10/06/2020   Procedure: ATRIAL FIBRILLATION ABLATION;  Surgeon: Vickie Epley, MD;  Location: [facility];  Service: Cardiovascular;  Laterality: N/A;   ATRIAL FLUTTER ABLATION     IMPLANTABLE LOOP RECORDER     RECONSTRUCTION OF NOSE     TONSILLECTOMY         Home Medications    Prior to Admission medications   Medication Sig Start Date End Date Taking? Authorizing Provider  cetirizine-pseudoephedrine (ZYRTEC-D) 5-120 MG tablet Take 1 tablet by mouth 2 (two) times daily for 15 days. 01/31/22 02/15/22 Yes Dejaun Vidrio-Warren, Alda Lea, NP  fluticasone (FLONASE) 50 MCG/ACT nasal spray Place 2 sprays into both nostrils daily. 01/31/22  Yes Gurfateh Mcclain-Warren, Alda Lea, NP    Family History History reviewed. No pertinent family history.  Social History Social History   Tobacco Use   Smoking status: Never   Smokeless tobacco: Never  Substance Use Topics   Alcohol use: Never   Drug use: Never     Allergies   Penicillins   Review of Systems Review of Systems  HENT:  Positive for ear pain.    Per  HPI  Physical Exam Triage Vital Signs ED Triage Vitals  Enc Vitals Group     BP 01/31/22 1036 113/76     Pulse Rate 01/31/22 1036 97     Resp 01/31/22 1036 16     Temp 01/31/22 1036 98.4 F (36.9 C)     Temp Source 01/31/22 1036 Oral     SpO2 01/31/22 1036 96 %     Weight --      Height --      Head Circumference --      Peak Flow --      Pain Score 01/31/22 1037 3     Pain Loc --      Pain Edu? --      Excl. in [place]? --    No data found.  Updated Vital Signs BP 113/76 (BP Location: Right Arm)   Pulse 97   Temp 98.4 F (36.9 C) (Oral)   Resp 16   SpO2 96%   Visual Acuity Right Eye Distance:   Left Eye Distance:   Bilateral Distance:    Right Eye Near:   Left Eye Near:    Bilateral Near:     Physical Exam Vitals and nursing note reviewed.  Constitutional:      General: He is not in acute distress.    Appearance: Normal appearance. He is well-developed.  HENT:  Head: Normocephalic and atraumatic.     Right Ear: Ear canal and external ear normal. No middle ear effusion.     Left Ear: Ear canal and external ear normal. A middle ear effusion is present.     Nose: Nose normal.     Mouth/Throat:     Mouth: Mucous membranes are moist.  Eyes:     Conjunctiva/sclera: Conjunctivae normal.  Cardiovascular:     Rate and Rhythm: Normal rate and regular rhythm.     Heart sounds: No murmur heard. Pulmonary:     Effort: Pulmonary effort is normal. No respiratory distress.     Breath sounds: Normal breath sounds.  Abdominal:     General: Bowel sounds are normal.     Palpations: Abdomen is soft.     Tenderness: There is no abdominal tenderness.  Musculoskeletal:        General: No swelling.     Cervical back: Normal range of motion and neck supple.  Skin:    General: Skin is warm and dry.     Capillary Refill: Capillary refill takes less than 2 seconds.  Neurological:     General: No focal deficit present.     Mental Status: He is alert and oriented to person,  place, and time.  Psychiatric:        Mood and Affect: Mood normal.        Behavior: Behavior normal.      UC Treatments / Results  Labs (all labs ordered are listed, but only abnormal results are displayed) Labs Reviewed - No data to display  EKG   Radiology No results found.  Procedures Procedures (including critical care time)  Medications Ordered in UC Medications - No data to display  Initial Impression / Assessment and Plan / UC Course  I have reviewed the triage vital signs and the nursing notes.  Pertinent labs & imaging results that were available during my care of the patient were reviewed by me and considered in my medical decision making (see chart for details).  Patient presents for complaints of left ear pain has been present for the past 2 days.  On exam, he has a left middle ear effusion.  Symptoms are consistent with a left eustachian tube dysfunction.  Symptomatic treatment was provided with Zyrtec-D and fluticasone.  Supportive care recommendations were provided to the patient.  Patient advised to follow-up with his primary care physician if symptoms do not improve. Final Clinical Impressions(s) / UC Diagnoses   Final diagnoses:  Acute dysfunction of left eustachian tube     Discharge Instructions      Use medication as prescribed. May take over-the-counter ibuprofen or Tylenol as needed for pain or discomfort. Warm compresses to the left ear for any pain or discomfort. Avoid sticking anything or inserting anything inside of the ear while symptoms persist. Follow-up with your primary care physician if symptoms do not improve.     ED Prescriptions     Medication Sig Dispense Auth. Provider   cetirizine-pseudoephedrine (ZYRTEC-D) 5-120 MG tablet Take 1 tablet by mouth 2 (two) times daily for 15 days. 30 tablet Kylie Gros-Warren, Alda Lea, NP   fluticasone (FLONASE) 50 MCG/ACT nasal spray Place 2 sprays into both nostrils daily. 16 g Anguel Delapena-Warren,  Alda Lea, NP      PDMP not reviewed this encounter.   Tish Men, NP 01/31/22 1104

## 2022-01-31 NOTE — Discharge Instructions (Addendum)
Use medication as prescribed. May take over-the-counter ibuprofen or Tylenol as needed for pain or discomfort. Warm compresses to the left ear for any pain or discomfort. Avoid sticking anything or inserting anything inside of the ear while symptoms persist. Follow-up with your primary care physician if symptoms do not improve.

## 2022-04-21 ENCOUNTER — Ambulatory Visit: Payer: 59 | Attending: Student | Admitting: Student

## 2022-04-21 ENCOUNTER — Encounter: Payer: Self-pay | Admitting: Student

## 2022-04-21 VITALS — BP 118/72 | HR 85 | Ht 72.0 in | Wt 160.0 lb

## 2022-04-21 DIAGNOSIS — I480 Paroxysmal atrial fibrillation: Secondary | ICD-10-CM | POA: Diagnosis not present

## 2022-04-21 DIAGNOSIS — Z79899 Other long term (current) drug therapy: Secondary | ICD-10-CM | POA: Diagnosis not present

## 2022-04-21 DIAGNOSIS — R1310 Dysphagia, unspecified: Secondary | ICD-10-CM

## 2022-04-21 LAB — BASIC METABOLIC PANEL
BUN/Creatinine Ratio: 19 (ref 9–20)
BUN: 16 mg/dL (ref 6–24)
CO2: 24 mmol/L (ref 20–29)
Calcium: 9.3 mg/dL (ref 8.7–10.2)
Chloride: 105 mmol/L (ref 96–106)
Creatinine, Ser: 0.86 mg/dL (ref 0.76–1.27)
Glucose: 152 mg/dL — ABNORMAL HIGH (ref 70–99)
Potassium: 4 mmol/L (ref 3.5–5.2)
Sodium: 142 mmol/L (ref 134–144)
eGFR: 100 mL/min/{1.73_m2} (ref >59)

## 2022-04-21 LAB — CBC
Hematocrit: 45.2 % (ref 37.5–51.0)
Hemoglobin: 15.3 g/dL (ref 13.0–17.7)
MCH: 30.5 pg (ref 26.6–33.0)
MCHC: 33.8 g/dL (ref 31.5–35.7)
MCV: 90 fL (ref 79–97)
Platelets: 230 10*3/uL (ref 150–450)
RBC: 5.01 x10E6/uL (ref 4.14–5.80)
RDW: 13 % (ref 11.6–15.4)
WBC: 6.3 10*3/uL (ref 3.4–10.8)

## 2022-04-21 MED ORDER — RIVAROXABAN 20 MG PO TABS: 20.0000 mg | ORAL_TABLET | Freq: Every day | ORAL | 3 refills | Status: DC

## 2022-04-21 MED ORDER — DILTIAZEM HCL 30 MG PO TABS: 30.0000 mg | ORAL_TABLET | Freq: Three times a day (TID) | ORAL | 3 refills | Status: AC | PRN

## 2022-04-21 NOTE — Patient Instructions (Signed)
Medication Instructions:  Your physician has recommended you make the following change in your medication:   START: Xarelto '20mg'$  daily START: Diltiazem '30mg'$  three times daily as needed for breakthrough A-Fib  *If you need a refill on your cardiac medications before your next appointment, please call your pharmacy*   Lab Work: TODAY: BMET, CBC  If you have labs (blood work) drawn today and your tests are completely normal, you will receive your results only by: [place] (if you have MyChart) OR A paper copy in the mail If you have any lab test that is abnormal or we need to change your treatment, we will call you to review the results.   Follow-Up: At [facility], you and your health needs are our priority.  As part of our continuing mission to provide you with exceptional heart care, we have created designated Provider Care Teams.  These Care Teams include your primary Cardiologist (physician) and Advanced Practice Providers (APPs -  Physician Assistants and Nurse Practitioners) who all work together to provide you with the care you need, when you need it.   Your next appointment:   As scheduled

## 2022-04-21 NOTE — Progress Notes (Signed)
PCP:  Cory Munch, PA-C Primary Cardiologist: None Electrophysiologist: Vickie Epley, MD   Richard Walsh is a 61 y.o. male seen today for Vickie Epley, MD for routine electrophysiology followup.  Since last being seen in our clinic the patient reports doing OK overall, but over the last few months he has had breakthrough AF with increasing frequency with palpitations and malaise. He is interested in limiting medications and considering a re-do ablation. He had a flutter ablation at outside hospital, and underwent PVI by Dr. Quentin Ore 09/2020.  he denies chest pain, dyspnea, PND, orthopnea, nausea, vomiting, dizziness, syncope, edema, weight gain, or early satiety.  Pt states he has a chronic issue of severe pill dysphagia with globus sensation. He has to "chew and chew" his food so that it doesn't occur. His grandmother had an issue with a "pouch" in her esophagus, though he has not had any formal evaluation.   Past Medical History:  Diagnosis Date   Fatigue    Palpitations    Paroxysmal atrial fibrillation (HCC)    Ventricular premature beats    Past Surgical History:  Procedure Laterality Date   ABLATE HEART DYSRHYTHM FOCUS     ATRIAL FIBRILLATION ABLATION N/A 10/06/2020   Procedure: ATRIAL FIBRILLATION ABLATION;  Surgeon: Vickie Epley, MD;  Location: [place] [facility];  Service: Cardiovascular;  Laterality: N/A;   ATRIAL FLUTTER ABLATION     IMPLANTABLE LOOP RECORDER     RECONSTRUCTION OF NOSE     TONSILLECTOMY      Current Outpatient Medications  Medication Sig Dispense Refill   fluticasone (FLONASE) 50 MCG/ACT nasal spray Place 2 sprays into both nostrils daily. (Patient not taking: Reported on 04/21/2022) 16 g 0   No current facility-administered medications for this visit.    Allergies  Allergen Reactions   Penicillins Hives    Reaction: 10 years    Social History   Socioeconomic History   Marital status: Married    Spouse name: Not on file    Number of children: Not on file   Years of education: Not on file   Highest education level: Not on file  Occupational History   Not on file  Tobacco Use   Smoking status: Never   Smokeless tobacco: Never  Substance and Sexual Activity   Alcohol use: Never   Drug use: Never   Sexual activity: Yes  Other Topics Concern   Not on file  Social History Narrative   Not on file   Social Determinants of Health   Financial Resource Strain: Not on file  Food Insecurity: Not on file  Transportation Needs: Not on file  Physical Activity: Not on file  Stress: Not on file  Social Connections: Not on file  Intimate Partner Violence: Not on file     Review of Systems: All other systems reviewed and are otherwise negative except as noted above.  Physical Exam: Vitals:   04/21/22 0821  BP: 118/72  Pulse: 85  SpO2: 98%  Weight: 160 lb (72.6 kg)  Height: 6' (1.829 m)    GEN- The patient is well appearing, alert and oriented x 3 today.   HEENT: normocephalic, atraumatic; sclera clear, conjunctiva pink; hearing intact; oropharynx clear; neck supple, no JVP Lymph- no cervical lymphadenopathy Lungs- Clear to ausculation bilaterally, normal work of breathing.  No wheezes, rales, rhonchi Heart- Regular rate and rhythm, no murmurs, rubs or gallops, PMI not laterally displaced GI- soft, non-tender, non-distended, bowel sounds present, no hepatosplenomegaly Extremities- no  clubbing, cyanosis, or edema; DP/PT/radial pulses 2+ bilaterally MS- no significant deformity or atrophy Skin- warm and dry, no rash or lesion Psych- euthymic mood, full affect Neuro- strength and sensation are intact  EKG is ordered. Personal review of EKG from today shows NSR at 90 bpm  Additional studies reviewed include: Previous EP office notes.   Assessment and Plan:  1. Paroxysmal atrial fibrillation (HCC)   2. Encounter for long-term (current) use of high-risk medication   S/p PVI 09/2020 Now with  increasing breakthrough by symptoms.  Restart Xarelto 20 mg daily to not delay definitive management.  Give diltiazem 30 mg to use prn. Pt refuses flecainide or long acting BB/CCB at this time.  Update baseline labs today.   3. Pill Dysphagia, severe At 61 yo he is grinding up all his pills and eating very slowly.  I have recommended GI consultation and will place referral.  Suspect this would be best addressed prior to further prolonged sedation and intubation.  Pt denies worsening since last ablation, states has been a long term chronic issue.   Follow up with Dr. Quentin Ore in  6 weeks    Shirley Friar, PA-C  04/21/22 8:32 AM

## 2022-05-19 ENCOUNTER — Encounter: Payer: Self-pay | Admitting: Internal Medicine

## 2022-05-27 NOTE — Progress Notes (Deleted)
Electrophysiology Office Follow up Visit Note:    Date:  05/27/2022   ID:  Richard Walsh, DOB 04/04/1961, MRN [id number]  PCP:  Ginger Organ  [facility] Cardiologist:  None  [facility] Electrophysiologist:  Vickie Epley, MD    Interval History:    Richard Walsh is a 61 y.o. male who presents for a follow up visit.  He has a history of paroxysmal atrial fibrillation and underwent an A-fib ablation on October 06, 2020.  During the ablation, the veins were isolated.  He saw Oda Kilts in follow-up on April 21, 2022.  At that appointment he reported breakthrough episodes of atrial fibrillation over the preceding several months that were increasing in frequency.  He was interested in pursuing a repeat catheter ablation to attempt to better control his arrhythmias.       Past Medical History:  Diagnosis Date   Fatigue    Palpitations    Paroxysmal atrial fibrillation (HCC)    Ventricular premature beats     Past Surgical History:  Procedure Laterality Date   ABLATE HEART DYSRHYTHM FOCUS     ATRIAL FIBRILLATION ABLATION N/A 10/06/2020   Procedure: ATRIAL FIBRILLATION ABLATION;  Surgeon: Vickie Epley, MD;  Location: [place] [facility];  Service: Cardiovascular;  Laterality: N/A;   ATRIAL FLUTTER ABLATION     IMPLANTABLE LOOP RECORDER     RECONSTRUCTION OF NOSE     TONSILLECTOMY      Current Medications: No outpatient medications have been marked as taking for the 05/28/22 encounter (Appointment) with Vickie Epley, MD.     Allergies:   Penicillins   Social History   Socioeconomic History   Marital status: Married    Spouse name: Not on file   Number of children: Not on file   Years of education: Not on file   Highest education level: Not on file  Occupational History   Not on file  Tobacco Use   Smoking status: Never   Smokeless tobacco: Never  Substance and Sexual Activity   Alcohol use: Never   Drug use: Never   Sexual  activity: Yes  Other Topics Concern   Not on file  Social History Narrative   Not on file   Social Determinants of Health   Financial Resource Strain: Not on file  Food Insecurity: Not on file  Transportation Needs: Not on file  Physical Activity: Not on file  Stress: Not on file  Social Connections: Not on file     Family History: The patient's family history is not on file.  ROS:   Please see the history of present illness.    All other systems reviewed and are negative.  EKGs/Labs/Other Studies Reviewed:    The following studies were reviewed today:   EKG:  The ekg ordered today demonstrates ***  Recent Labs: 04/21/2022: BUN 16; Creatinine, Ser 0.86; Hemoglobin 15.3; Platelets 230; Potassium 4.0; Sodium 142  Recent Lipid Panel No results found for: "CHOL", "TRIG", "HDL", "CHOLHDL", "VLDL", "LDLCALC", "LDLDIRECT"  Physical Exam:    VS:  There were no vitals taken for this visit.    Wt Readings from Last 3 Encounters:  04/21/22 160 lb (72.6 kg)  07/14/21 162 lb 6.4 oz (73.7 kg)  01/06/21 157 lb 6.4 oz (71.4 kg)     GEN: *** Well nourished, well developed in no acute distress HEENT: Normal NECK: No JVD; No carotid bruits LYMPHATICS: No lymphadenopathy CARDIAC: ***RRR, no murmurs, rubs, gallops RESPIRATORY:  Clear to  auscultation without rales, wheezing or rhonchi  ABDOMEN: Soft, non-tender, non-distended MUSCULOSKELETAL:  No edema; No deformity  SKIN: Warm and dry NEUROLOGIC:  Alert and oriented x 3 PSYCHIATRIC:  Normal affect        ASSESSMENT:    1. Paroxysmal atrial fibrillation (HCC)    PLAN:    In order of problems listed above:  #Paroxysmal atrial fibrillation Prior flutter ablation at outside hospital and A-fib ablation at [facility] October 06, 2020 during which the pulmonary veins were isolated. Now with recurrent episodes of symptomatic atrial fibrillation.  The patient would like to to pursue a rhythm control strategy that avoids  long-term exposure antiarrhythmic drugs.           Total time spent with patient today *** minutes. This includes reviewing records, evaluating the patient and coordinating care.   Medication Adjustments/Labs and Tests Ordered: Current medicines are reviewed at length with the patient today.  Concerns regarding medicines are outlined above.  No orders of the defined types were placed in this encounter.  No orders of the defined types were placed in this encounter.    Signed, Lars Mage, MD, [facility], [facility] 05/27/2022 8:43 PM    Electrophysiology [facility]

## 2022-05-28 ENCOUNTER — Ambulatory Visit: Payer: Managed Care, Other (non HMO) | Attending: Cardiology | Admitting: Cardiology

## 2022-05-28 ENCOUNTER — Encounter: Payer: Self-pay | Admitting: Cardiology

## 2022-05-28 VITALS — BP 116/74 | HR 87 | Ht 72.0 in | Wt 161.4 lb

## 2022-05-28 DIAGNOSIS — I480 Paroxysmal atrial fibrillation: Secondary | ICD-10-CM | POA: Diagnosis not present

## 2022-05-28 MED ORDER — ASPIRIN 81 MG PO TBEC: 81.0000 mg | DELAYED_RELEASE_TABLET | Freq: Every day | ORAL | 3 refills | Status: AC

## 2022-05-28 NOTE — Progress Notes (Signed)
Electrophysiology Office Follow up Visit Note:    Date:  05/28/2022   ID:  Richard Walsh, DOB August 17, 1961, MRN [id number]  PCP:  Sharilyn Sites, MD  [facility] Cardiologist:  None  [facility] Electrophysiologist:  Vickie Epley, MD    Interval History:    Richard Walsh is a 61 y.o. male who presents for a follow up visit.  He has a history of paroxysmal atrial fibrillation and underwent an A-fib ablation on October 06, 2020.  During the ablation, the veins were isolated.  He saw Oda Kilts in follow-up on April 21, 2022.  At that appointment he reported breakthrough episodes of atrial fibrillation over the preceding several months that were increasing in frequency.  He was interested in pursuing a repeat catheter ablation to attempt to better control his arrhythmias.  He states that he began experiencing episodes of A-fib more frequently since August 2023. He is having ~2 episodes per week of waxing and waning severity. They last for approximately 1 hour at a time with use of Diltiazem as needed. His primary symptoms is severe fatigue. He reports having a minor episode of A-fib this morning which resolved without needing the Diltiazem.  He is happy with his current medication regimen and is without side effects. However, he is still needing to crush his pills due to his problem with swallowing some solids. He would not like to add any medications to his regimen at this time.     Past Medical History:  Diagnosis Date   Fatigue    Palpitations    Paroxysmal atrial fibrillation (HCC)    Ventricular premature beats     Past Surgical History:  Procedure Laterality Date   ABLATE HEART DYSRHYTHM FOCUS     ATRIAL FIBRILLATION ABLATION N/A 10/06/2020   Procedure: ATRIAL FIBRILLATION ABLATION;  Surgeon: Vickie Epley, MD;  Location: [place] [facility];  Service: Cardiovascular;  Laterality: N/A;   ATRIAL FLUTTER ABLATION     IMPLANTABLE LOOP RECORDER     RECONSTRUCTION OF  NOSE     TONSILLECTOMY      Current Medications: Current Meds  Medication Sig   aspirin EC 81 MG tablet Take 1 tablet (81 mg total) by mouth daily. Swallow whole.   diltiazem (CARDIZEM) 30 MG tablet Take 1 tablet (30 mg total) by mouth 3 (three) times daily as needed (for breakthrough A-Fib).   [DISCONTINUED] rivaroxaban (XARELTO) 20 MG TABS tablet Take 1 tablet (20 mg total) by mouth daily with supper.     Allergies:   Penicillins   Social History   Socioeconomic History   Marital status: Married    Spouse name: Not on file   Number of children: Not on file   Years of education: Not on file   Highest education level: Not on file  Occupational History   Not on file  Tobacco Use   Smoking status: Never   Smokeless tobacco: Never  Substance and Sexual Activity   Alcohol use: Never   Drug use: Never   Sexual activity: Yes  Other Topics Concern   Not on file  Social History Narrative   Not on file   Social Determinants of Health   Financial Resource Strain: Not on file  Food Insecurity: Not on file  Transportation Needs: Not on file  Physical Activity: Not on file  Stress: Not on file  Social Connections: Not on file     Family History: The patient's family history is not on file.  ROS:  Please see the history of present illness.    All other systems reviewed and are negative.  EKGs/Labs/Other Studies Reviewed:    The following studies were reviewed today:   EKG:  The ekg ordered today demonstrates sinus rhythm.  Ventricular rate 87 bpm.  Recent Labs: 04/21/2022: BUN 16; Creatinine, Ser 0.86; Hemoglobin 15.3; Platelets 230; Potassium 4.0; Sodium 142  Recent Lipid Panel No results found for: "CHOL", "TRIG", "HDL", "CHOLHDL", "VLDL", "LDLCALC", "LDLDIRECT"  Physical Exam:    VS:  BP 116/74   Pulse 87   Ht 6' (1.829 m)   Wt 161 lb 6.4 oz (73.2 kg)   SpO2 96%   BMI 21.89 kg/m     Wt Readings from Last 3 Encounters:  05/28/22 161 lb 6.4 oz (73.2 kg)   04/21/22 160 lb (72.6 kg)  07/14/21 162 lb 6.4 oz (73.7 kg)     GEN:  Well nourished, well developed in no acute distress HEENT: Normal NECK: No JVD; No carotid bruits LYMPHATICS: No lymphadenopathy CARDIAC: RRR, no murmurs, rubs, gallops RESPIRATORY:  Clear to auscultation without rales, wheezing or rhonchi  ABDOMEN: Soft, non-tender, non-distended MUSCULOSKELETAL:  No edema; No deformity  SKIN: Warm and dry NEUROLOGIC:  Alert and oriented x 3 PSYCHIATRIC:  Normal affect        ASSESSMENT:    1. Paroxysmal atrial fibrillation (HCC)    PLAN:    In order of problems listed above:  #Paroxysmal atrial fibrillation Prior flutter ablation at outside hospital and A-fib ablation at [facility] October 06, 2020 during which the pulmonary veins were isolated. Now with recurrent episodes of symptomatic atrial fibrillation.  They are fairly sporadic without clear triggers.  He takes as needed Cardizem which seems to control the arrhythmia.    Repeat catheter ablation discussed. He would not like to pursue this option at this time.  He would not like to trial Flecainide. Will continue with Diltiazem prn.  Given his low CHA2DS2-VASc score he will stop Xarelto and start baby aspirin once daily.  Follow up in 1 year with APP.  CHA2DS2-VASc Score = 0  The patient's score is based upon: CHF History: 0 HTN History: 0 Diabetes History: 0 Stroke History: 0 Vascular Disease History: 0 Age Score: 0 Gender Score: 0   Medication Adjustments/Labs and Tests Ordered: Current medicines are reviewed at length with the patient today.  Concerns regarding medicines are outlined above.  Orders Placed This Encounter  Procedures   EKG 12-Lead   Meds ordered this encounter  Medications   aspirin EC 81 MG tablet    Sig: Take 1 tablet (81 mg total) by mouth daily. Swallow whole.    Dispense:  90 tablet    Refill:  3   This document serves as a record of services personally performed by  Lars Mage, MD. It was created on her behalf by Eugene Gavia, a trained medical scribe. The creation of this record is based on the scribe's personal observations and the provider's statements to them. This document has been checked and approved by the attending provider.  I, Vickie Epley, MD, have reviewed all documentation for this visit. The documentation on 05/28/22 for the exam, diagnosis, procedures, and orders are all accurate and complete.    Signed, Lars Mage, MD, [facility], [facility] 05/28/2022 10:07 AM    Electrophysiology [facility]

## 2022-05-28 NOTE — Progress Notes (Addendum)
Electrophysiology Office Follow up Visit Note:    Date:  05/28/2022   ID:  Particia Lather, DOB 18-Dec-1960, MRN [id number]  PCP:  Sharilyn Sites, MD  [facility] Cardiologist:  None  [facility] Electrophysiologist:  Vickie Epley, MD    Interval History:    Richard Walsh is a 61 y.o. male who presents for a follow up visit.  He has a history of paroxysmal atrial fibrillation and underwent an A-fib ablation on October 06, 2020.  During the ablation, the veins were isolated.  He saw Oda Kilts in follow-up on April 21, 2022.  At that appointment he reported breakthrough episodes of atrial fibrillation over the preceding several months that were increasing in frequency.  He was interested in pursuing a repeat catheter ablation to attempt to better control his arrhythmias.  He states that he began experiencing episodes of A-fib more frequently since August 2023. He is having ~2 episodes per week of waxing and waning severity. They last for approximately 1 hour at a time with use of Diltiazem as needed. His primary symptoms is severe fatigue. He reports having a minor episode of A-fib this morning which resolved without needing the Diltiazem.  He is happy with his current medication regimen and is without side effects. However, he is still needing to crush his pills due to his problem with swallowing some solids. He would not like to add any medications to his regimen at this time.     Past Medical History:  Diagnosis Date   Fatigue    Palpitations    Paroxysmal atrial fibrillation (HCC)    Ventricular premature beats     Past Surgical History:  Procedure Laterality Date   ABLATE HEART DYSRHYTHM FOCUS     ATRIAL FIBRILLATION ABLATION N/A 10/06/2020   Procedure: ATRIAL FIBRILLATION ABLATION;  Surgeon: Vickie Epley, MD;  Location: [place] [facility];  Service: Cardiovascular;  Laterality: N/A;   ATRIAL FLUTTER ABLATION     IMPLANTABLE LOOP RECORDER     RECONSTRUCTION OF  NOSE     TONSILLECTOMY      Current Medications: Current Meds  Medication Sig   aspirin EC 81 MG tablet Take 1 tablet (81 mg total) by mouth daily. Swallow whole.   diltiazem (CARDIZEM) 30 MG tablet Take 1 tablet (30 mg total) by mouth 3 (three) times daily as needed (for breakthrough A-Fib).   [DISCONTINUED] rivaroxaban (XARELTO) 20 MG TABS tablet Take 1 tablet (20 mg total) by mouth daily with supper.     Allergies:   Penicillins   Social History   Socioeconomic History   Marital status: Married    Spouse name: Not on file   Number of children: Not on file   Years of education: Not on file   Highest education level: Not on file  Occupational History   Not on file  Tobacco Use   Smoking status: Never   Smokeless tobacco: Never  Substance and Sexual Activity   Alcohol use: Never   Drug use: Never   Sexual activity: Yes  Other Topics Concern   Not on file  Social History Narrative   Not on file   Social Determinants of Health   Financial Resource Strain: Not on file  Food Insecurity: Not on file  Transportation Needs: Not on file  Physical Activity: Not on file  Stress: Not on file  Social Connections: Not on file     Family History: The patient's family history is not on file.  ROS:  Please see the history of present illness.    All other systems reviewed and are negative.  EKGs/Labs/Other Studies Reviewed:    The following studies were reviewed today:   EKG:  The ekg ordered today demonstrates sinus rhythm.  Ventricular rate 87 bpm.  Recent Labs: 04/21/2022: BUN 16; Creatinine, Ser 0.86; Hemoglobin 15.3; Platelets 230; Potassium 4.0; Sodium 142  Recent Lipid Panel No results found for: "CHOL", "TRIG", "HDL", "CHOLHDL", "VLDL", "LDLCALC", "LDLDIRECT"  Physical Exam:    VS:  BP 116/74   Pulse 87   Ht 6' (1.829 m)   Wt 161 lb 6.4 oz (73.2 kg)   SpO2 96%   BMI 21.89 kg/m     Wt Readings from Last 3 Encounters:  05/28/22 161 lb 6.4 oz (73.2 kg)   04/21/22 160 lb (72.6 kg)  07/14/21 162 lb 6.4 oz (73.7 kg)     GEN:  Well nourished, well developed in no acute distress HEENT: Normal NECK: No JVD; No carotid bruits LYMPHATICS: No lymphadenopathy CARDIAC: RRR, no murmurs, rubs, gallops RESPIRATORY:  Clear to auscultation without rales, wheezing or rhonchi  ABDOMEN: Soft, non-tender, non-distended MUSCULOSKELETAL:  No edema; No deformity  SKIN: Warm and dry NEUROLOGIC:  Alert and oriented x 3 PSYCHIATRIC:  Normal affect        ASSESSMENT:    1. Paroxysmal atrial fibrillation (HCC)    PLAN:    In order of problems listed above:  #Paroxysmal atrial fibrillation Prior flutter ablation at outside hospital and A-fib ablation at [facility] October 06, 2020 during which the pulmonary veins were isolated. Now with recurrent episodes of symptomatic atrial fibrillation.  They are fairly sporadic without clear triggers.  He takes as needed Cardizem which seems to control the arrhythmia.    Repeat catheter ablation discussed. He would not like to pursue this option at this time.  He would not like to trial Flecainide. Will continue with Diltiazem prn.  Given his low CHA2DS2-VASc score he will stop Xarelto and start baby aspirin once daily.  Follow up in 1 year with APP.  CHA2DS2-VASc Score = 0  The patient's score is based upon: CHF History: 0 HTN History: 0 Diabetes History: 0 Stroke History: 0 Vascular Disease History: 0 Age Score: 0 Gender Score: 0   Medication Adjustments/Labs and Tests Ordered: Current medicines are reviewed at length with the patient today.  Concerns regarding medicines are outlined above.  Orders Placed This Encounter  Procedures   EKG 12-Lead   Meds ordered this encounter  Medications   aspirin EC 81 MG tablet    Sig: Take 1 tablet (81 mg total) by mouth daily. Swallow whole.    Dispense:  90 tablet    Refill:  3   This document serves as a record of services personally performed by  Lars Mage, MD. It was created on her behalf by Eugene Gavia, a trained medical scribe. The creation of this record is based on the scribe's personal observations and the provider's statements to them. This document has been checked and approved by the attending provider.  I, Vickie Epley, MD, have reviewed all documentation for this visit. The documentation on 05/28/22 for the exam, diagnosis, procedures, and orders are all accurate and complete.    Signed, Lars Mage, MD, [facility], [facility] 05/28/2022 10:07 AM    Electrophysiology [facility]

## 2022-05-28 NOTE — Patient Instructions (Addendum)
Medication Instructions:  Stop Xarelto  Start 81 mg Aspirin  *If you need a refill on your cardiac medications before your next appointment, please call your pharmacy*   Lab Work: none If you have labs (blood work) drawn today and your tests are completely normal, you will receive your results only by: [place] (if you have MyChart) OR A paper copy in the mail If you have any lab test that is abnormal or we need to change your treatment, we will call you to review the results.   Testing/Procedures: None    Follow-Up: At [facility], you and your health needs are our priority.  As part of our continuing mission to provide you with exceptional heart care, we have created designated Provider Care Teams.  These Care Teams include your primary Cardiologist (physician) and Advanced Practice Providers (APPs -  Physician Assistants and Nurse Practitioners) who all work together to provide you with the care you need, when you need it.  We recommend signing up for the patient portal called "MyChart".  Sign up information is provided on this After Visit Summary.  MyChart is used to connect with patients for Virtual Visits (Telemedicine).  Patients are able to view lab/test results, encounter notes, upcoming appointments, etc.  Non-urgent messages can be sent to your provider as well.   To learn more about what you can do with MyChart, go to [email or web address].    Your next appointment:   1 year(s)  The format for your next appointment:   In Person  Provider:   You will see one of the following Advanced Practice Providers on your designated Care Team:    Legrand Como "Jonni Sanger" Chalmers Cater, [place]      Other Instructions none  Important Information About [place]

## 2022-06-23 ENCOUNTER — Encounter: Payer: Self-pay | Admitting: Internal Medicine

## 2022-06-23 ENCOUNTER — Ambulatory Visit (INDEPENDENT_AMBULATORY_CARE_PROVIDER_SITE_OTHER): Payer: Managed Care, Other (non HMO) | Admitting: Internal Medicine

## 2022-06-23 VITALS — BP 116/84 | HR 85 | Ht 72.0 in | Wt 165.1 lb

## 2022-06-23 DIAGNOSIS — R1310 Dysphagia, unspecified: Secondary | ICD-10-CM | POA: Diagnosis not present

## 2022-06-23 NOTE — Progress Notes (Signed)
Chief Complaint: Dysphagia  HPI : 61 year old male with history of A-fib presents with dysphagia  He has had dysphagia for years that has been stable in severity. Dysphagia occurs with pills and foods such as nuts. At one point he had a pill get stuck in his lower throat for 2 days. He has had to gag pills back up in the past. Denies any vomiting. He will sometimes put pills in tootsie rolls and bread to be able to swallow them. He also tries to chew carefully. Denies dysphagia to liquids. Denies odynophagia, melena, hematochezia, or ab pain. Grandmother could not swallow pills either because she had loosening of the esophagus. Denies coughing. Weight has been stable. Family history of UC in daughter. Denies prior EGD. Last colonoscopy was 9 years ago that was normal. He is not on any blood thinners.   Wt Readings from Last 3 Encounters:  06/23/22 165 lb 2 oz (74.9 kg)  05/28/22 161 lb 6.4 oz (73.2 kg)  04/21/22 160 lb (72.6 kg)    Past Medical History:  Diagnosis Date   Fatigue    Palpitations    Paroxysmal atrial fibrillation (HCC)    Ventricular premature beats     Past Surgical History:  Procedure Laterality Date   ABLATE HEART DYSRHYTHM FOCUS     ATRIAL FIBRILLATION ABLATION N/A 10/06/2020   Procedure: ATRIAL FIBRILLATION ABLATION;  Surgeon: Vickie Epley, MD;  Location: [facility];  Service: Cardiovascular;  Laterality: N/A;   ATRIAL FLUTTER ABLATION     COLONOSCOPY     [place] [facility]. Says nothing was found. Thinks Age 50   IMPLANTABLE LOOP RECORDER     RECONSTRUCTION OF NOSE     TONSILLECTOMY     Family History  Problem Relation Age of Onset   Ulcerative colitis Daughter    Colon cancer Neg Hx    Esophageal cancer Neg Hx    Social History   Tobacco Use   Smoking status: Never   Smokeless tobacco: Never  Vaping Use   Vaping Use: Never used  Substance Use Topics   Alcohol use: Never   Drug use: Never   Current Outpatient Medications   Medication Sig Dispense Refill   aspirin EC 81 MG tablet Take 1 tablet (81 mg total) by mouth daily. Swallow whole. 90 tablet 3   diltiazem (CARDIZEM) 30 MG tablet Take 1 tablet (30 mg total) by mouth 3 (three) times daily as needed (for breakthrough A-Fib). 90 tablet 3   No current facility-administered medications for this visit.   Allergies  Allergen Reactions   Penicillins Hives    Reaction: 10 years     Review of Systems: All systems reviewed and negative except where noted in HPI.   Physical Exam: BP 116/84   Pulse 85   Ht 6' (1.829 m)   Wt 165 lb 2 oz (74.9 kg)   BMI 22.39 kg/m  Constitutional: Pleasant,well-developed, male in no acute distress. HEENT: Normocephalic and atraumatic. Conjunctivae are normal. No scleral icterus. Cardiovascular: Normal rate, regular rhythm.  Pulmonary/chest: Effort normal and breath sounds normal. No wheezing, rales or rhonchi. Abdominal: Soft, nondistended, nontender. Bowel sounds active throughout. There are no masses palpable. No hepatomegaly. Extremities: No edema Neurological: Alert and oriented to person place and time. Skin: Skin is warm and dry. No rashes noted. Psychiatric: Normal mood and affect. Behavior is normal.  Labs 03/2022: CBC nml. BMP with elevated glucose.  ASSESSMENT AND PLAN: Dysphagia Patient presents with dysphagia  to pills and nuts for the last several years that has been stable in quality. Causes could include an esophageal diverticulum, esophageal stricture, esophageal motility disorder, or malignancy. Will plan for EGD and barium swallow for further evaluation. - Barium swallow  - EGD [facility] - Patient will be due next year for colonoscopy for colon cancer screening  Christia Reading, MD

## 2022-06-23 NOTE — Patient Instructions (Signed)
_______________________________________________________  If you are age 59 or older, your body mass index should be between 23-30. Your Body mass index is 22.39 kg/m. If this is out of the aforementioned range listed, please consider follow up with your Primary Care Provider.  If you are age 54 or younger, your body mass index should be between 19-25. Your Body mass index is 22.39 kg/m. If this is out of the aformentioned range listed, please consider follow up with your Primary Care Provider.   You will be contacted by [facility] in the next 2 days to arrange a Barium swallow.  The number on your caller ID will be [phone], please answer when they call.  If you have not heard from them in 2 days please call [phone] to schedule.    You have been scheduled for an endoscopy. Please follow written instructions given to you at your visit today. If you use inhalers (even only as needed), please bring them with you on the day of your procedure.  The [facility] providers would like to encourage you to use [facility] to communicate with providers for non-urgent requests or questions.  Due to long hold times on the telephone, sending your provider a message by [facility] may be a faster and more efficient way to get a response.  Please allow 48 business hours for a response.  Please remember that this is for non-urgent requests.   Thank you for entrusting me with your care and for choosing [facility], Dr. Christia Reading

## 2022-07-08 ENCOUNTER — Encounter: Payer: Managed Care, Other (non HMO) | Admitting: Internal Medicine

## 2022-07-09 ENCOUNTER — Ambulatory Visit (HOSPITAL_COMMUNITY)
Admission: RE | Admit: 2022-07-09 | Discharge: 2022-07-09 | Disposition: A | Discharge status: Home / Self Care | Payer: Managed Care, Other (non HMO) | Source: Ambulatory Visit | Attending: Internal Medicine | Admitting: Internal Medicine

## 2022-07-09 DIAGNOSIS — R1310 Dysphagia, unspecified: Secondary | ICD-10-CM | POA: Insufficient documentation

## 2022-07-13 ENCOUNTER — Encounter: Payer: Self-pay | Admitting: Internal Medicine

## 2022-07-13 ENCOUNTER — Ambulatory Visit: Payer: Managed Care, Other (non HMO) | Admitting: Internal Medicine

## 2022-07-13 VITALS — BP 126/91 | HR 68 | Temp 96.8°F | Resp 16 | Ht 72.0 in | Wt 165.0 lb

## 2022-07-13 DIAGNOSIS — R1310 Dysphagia, unspecified: Secondary | ICD-10-CM

## 2022-07-13 DIAGNOSIS — K317 Polyp of stomach and duodenum: Secondary | ICD-10-CM | POA: Diagnosis not present

## 2022-07-13 DIAGNOSIS — K2970 Gastritis, unspecified, without bleeding: Secondary | ICD-10-CM | POA: Diagnosis not present

## 2022-07-13 DIAGNOSIS — K319 Disease of stomach and duodenum, unspecified: Secondary | ICD-10-CM | POA: Diagnosis not present

## 2022-07-13 DIAGNOSIS — D132 Benign neoplasm of duodenum: Secondary | ICD-10-CM | POA: Diagnosis not present

## 2022-07-13 MED ORDER — SODIUM CHLORIDE 0.9 % IV SOLN: 500.0000 mL | Freq: Once | INTRAVENOUS | Status: AC

## 2022-07-13 MED ORDER — OMEPRAZOLE 40 MG PO CPDR: 40.0000 mg | DELAYED_RELEASE_CAPSULE | Freq: Two times a day (BID) | ORAL | 0 refills | Status: DC

## 2022-07-13 NOTE — Progress Notes (Signed)
Called to room to assist during endoscopic procedure.  Patient ID and intended procedure confirmed with present staff. Received instructions for my participation in the procedure from the performing physician.  

## 2022-07-13 NOTE — Progress Notes (Signed)
A and O x3. Report to RN. Tolerated MAC anesthesia well.Teeth unchanged after procedure. 

## 2022-07-13 NOTE — Op Note (Signed)
[place] Patient Name: Richard Walsh Procedure Date: 07/13/2022 1:55 PM MRN: [id number] Endoscopist: Adline Mango Teresita , , [id number] Age: 61 Referring MD:  Date of Birth: 09-06-1960 Gender: Male Account #: [id number] Procedure:                Upper GI endoscopy Indications:              Dysphagia Medicines:                Monitored Anesthesia Care Procedure:                Pre-Anesthesia Assessment:                           - Prior to the procedure, a History and Physical                            was performed, and patient medications and                            allergies were reviewed. The patient's tolerance of                            previous anesthesia was also reviewed. The risks                            and benefits of the procedure and the sedation                            options and risks were discussed with the patient.                            All questions were answered, and informed consent                            was obtained. Prior Anticoagulants: The patient has                            taken no anticoagulant or antiplatelet agents. ASA                            Grade Assessment: II - A patient with mild systemic                            disease. After reviewing the risks and benefits,                            the patient was deemed in satisfactory condition to                            undergo the procedure.                           After obtaining informed consent, the endoscope was  passed under direct vision. Throughout the                            procedure, the patient's blood pressure, pulse, and                            oxygen saturations were monitored continuously. The                            GIF [id number] #[id number] was introduced through the                            mouth, and advanced to the second part of duodenum.                            The upper GI endoscopy was accomplished  without                            difficulty. The patient tolerated the procedure                            well. Scope In: Scope Out: Findings:                 One benign-appearing, intrinsic moderate                            (circumferential scarring or stenosis; an endoscope                            may pass) stenosis was found at the                            gastroesophageal junction. This stenosis measured                            less than one cm (in length). The stenosis was                            traversed. A TTS dilator was passed through the                            scope. Dilation with an 18-19-20 mm balloon dilator                            was performed to 20 mm. The dilation site was                            examined and showed mild mucosal disruption.                           Biopsies were taken with a cold forceps in the  proximal esophagus and in the distal esophagus for                            histology.                           Localized mild inflammation characterized by                            congestion (edema), erosions and erythema was found                            in the gastric body. Biopsies were taken with a                            cold forceps for histology.                           Five 3 to 7 mm sessile polyps with no bleeding and                            no stigmata of recent bleeding were found in the                            gastric body. These polyps were removed with a cold                            snare. Resection and retrieval were complete.                           A single 3 mm sessile polyp with no bleeding was                            found in the second portion of the duodenum. The                            polyp was removed with a cold snare. Resection and                            retrieval were complete. Complications:            No immediate complications. Estimated  Blood Loss:     Estimated blood loss was minimal. Impression:               - Benign-appearing esophageal stenosis. Dilated.                           - Gastritis. Biopsied.                           - Five gastric polyps. Resected and retrieved.                           - A single duodenal polyp. Resected and retrieved.                           -  Biopsies were taken with a cold forceps for                            histology in the proximal esophagus and in the                            distal esophagus. Recommendation:           - Discharge patient to home (with escort).                           - Await pathology results.                           - Use Prilosec (omeprazole) 40 mg PO BID for 8                            weeks.                           - Return to GI clinic in 6 weeks.                           - The findings and recommendations were discussed                            with the patient. Dr Georgian Co "Lyndee Leo" Lorenso Courier,  07/13/2022 2:29:30 PM

## 2022-07-13 NOTE — Patient Instructions (Signed)
Handout on gastritis and stricture given to patient. Await pathology results. Pick up prescription for omeprazole 40 mg twice a day for 8 weeks - [facility] off of [address]  Follow-up appointment in 6 weeks (call to scheduled appointment) Follow post-dilation diet today and back to normal diet tomorrow.   YOU HAD AN ENDOSCOPIC PROCEDURE TODAY AT [facility]:   Refer to the procedure report that was given to you for any specific questions about what was found during the examination.  If the procedure report does not answer your questions, please call your gastroenterologist to clarify.  If you requested that your care partner not be given the details of your procedure findings, then the procedure report has been included in a sealed envelope for you to review at your convenience later.  YOU SHOULD EXPECT: Some feelings of bloating in the abdomen. Passage of more gas than usual.  Walking can help get rid of the air that was put into your GI tract during the procedure and reduce the bloating. If you had a lower endoscopy (such as a colonoscopy or flexible sigmoidoscopy) you may notice spotting of blood in your stool or on the toilet paper. If you underwent a bowel prep for your procedure, you may not have a normal bowel movement for a few days.  Please Note:  You might notice some irritation and congestion in your nose or some drainage.  This is from the oxygen used during your procedure.  There is no need for concern and it should clear up in a day or so.  SYMPTOMS TO REPORT IMMEDIATELY:  Following upper endoscopy (EGD)  Vomiting of blood or coffee ground material  New chest pain or pain under the shoulder blades  Painful or persistently difficult swallowing  New shortness of breath  Fever of 100F or higher  Black, tarry-looking stools  For urgent or emergent issues, a gastroenterologist can be reached at any hour by calling [phone]. Do not use MyChart  messaging for urgent concerns.    DIET:  We do recommend a small meal at first, but then you may proceed to your regular diet.  Drink plenty of fluids but you should avoid alcoholic beverages for 24 hours.  ACTIVITY:  You should plan to take it easy for the rest of today and you should NOT DRIVE or use heavy machinery until tomorrow (because of the sedation medicines used during the test).    FOLLOW UP: Our staff will call the number listed on your records the next business day following your procedure.  We will call around 7:15- 8:00 am to check on you and address any questions or concerns that you may have regarding the information given to you following your procedure. If we do not reach you, we will leave a message.     If any biopsies were taken you will be contacted by phone or by letter within the next 1-3 weeks.  Please call us at [phone] if you have not heard about the biopsies in 3 weeks.    SIGNATURES/CONFIDENTIALITY: You and/or your care partner have signed paperwork which will be entered into your electronic medical record.  These signatures attest to the fact that that the information above on your After Visit Summary has been reviewed and is understood.  Full responsibility of the confidentiality of this discharge information lies with you and/or your care-partner.

## 2022-07-13 NOTE — Progress Notes (Signed)
Pt's states no medical or surgical changes since previsit or office visit. 

## 2022-07-13 NOTE — Progress Notes (Signed)
GASTROENTEROLOGY PROCEDURE H&P NOTE   Primary Care Physician: Sharilyn Sites, MD    Reason for Procedure:   Dysphagia  Plan:    EGD  Patient is appropriate for endoscopic procedure(s) in the ambulatory ([facility]) setting.  The nature of the procedure, as well as the risks, benefits, and alternatives were carefully and thoroughly reviewed with the patient. Ample time for discussion and questions allowed. The patient understood, was satisfied, and agreed to proceed.     HPI: Richard Walsh is a 61 y.o. male who presents for EGD for evaluation of dysphagia .  Patient was most recently seen in the Gastroenterology Clinic on 06/23/22.  No interval change in medical history since that appointment. Please refer to that note for full details regarding GI history and clinical presentation.   Past Medical History:  Diagnosis Date   Fatigue    Palpitations    Paroxysmal atrial fibrillation (HCC)    Ventricular premature beats     Past Surgical History:  Procedure Laterality Date   ABLATE HEART DYSRHYTHM FOCUS     ATRIAL FIBRILLATION ABLATION N/A 10/06/2020   Procedure: ATRIAL FIBRILLATION ABLATION;  Surgeon: Vickie Epley, MD;  Location: [facility];  Service: Cardiovascular;  Laterality: N/A;   ATRIAL FLUTTER ABLATION     COLONOSCOPY     [place] [facility]. Says nothing was found. Thinks Age 38   IMPLANTABLE LOOP RECORDER     RECONSTRUCTION OF NOSE     TONSILLECTOMY      Prior to Admission medications   Medication Sig Start Date End Date Taking? Authorizing Provider  aspirin EC 81 MG tablet Take 1 tablet (81 mg total) by mouth daily. Swallow whole. 05/28/22  Yes Vickie Epley, MD  diltiazem (CARDIZEM) 30 MG tablet Take 1 tablet (30 mg total) by mouth 3 (three) times daily as needed (for breakthrough A-Fib). 04/21/22   Shirley Friar, PA-C    Current Outpatient Medications  Medication Sig Dispense Refill   aspirin EC 81 MG tablet Take 1 tablet (81  mg total) by mouth daily. Swallow whole. 90 tablet 3   diltiazem (CARDIZEM) 30 MG tablet Take 1 tablet (30 mg total) by mouth 3 (three) times daily as needed (for breakthrough A-Fib). 90 tablet 3   Current Facility-Administered Medications  Medication Dose Route Frequency Provider Last Rate Last Admin   0.9 %  sodium chloride infusion  500 mL Intravenous Once Sharyn Creamer, MD        Allergies as of 07/13/2022 - Review Complete 07/13/2022  Allergen Reaction Noted   Penicillins Hives 06/25/2020    Family History  Problem Relation Age of Onset   Ulcerative colitis Daughter    Colon cancer Neg Hx    Esophageal cancer Neg Hx    Stomach cancer Neg Hx    Rectal cancer Neg Hx     Social History   Socioeconomic History   Marital status: Married    Spouse name: Not on file   Number of children: Not on file   Years of education: Not on file   Highest education level: Not on file  Occupational History   Not on file  Tobacco Use   Smoking status: Never   Smokeless tobacco: Never  Vaping Use   Vaping Use: Never used  Substance and Sexual Activity   Alcohol use: Never   Drug use: Never   Sexual activity: Yes  Other Topics Concern   Not on file  Social History Narrative  Not on file   Social Determinants of Health   Financial Resource Strain: Not on file  Food Insecurity: Not on file  Transportation Needs: Not on file  Physical Activity: Not on file  Stress: Not on file  Social Connections: Not on file  Intimate Partner Violence: Not on file    Physical Exam: Vital signs in last 24 hours: BP 124/84   Pulse 79   Temp (!) 96.8 F (36 C)   Ht 6' (1.829 m)   Wt 165 lb (74.8 kg)   SpO2 97%   BMI 22.38 kg/m  GEN: NAD EYE: Sclerae anicteric ENT: MMM CV: Non-tachycardic Pulm: No increased WOB GI: Soft NEURO:  Alert & Oriented   Christia Reading, MD [facility]   07/13/2022 1:32 PM

## 2022-07-14 ENCOUNTER — Telehealth: Payer: Self-pay | Admitting: *Deleted

## 2022-07-14 NOTE — Telephone Encounter (Signed)
  Follow up Call-     07/13/2022    1:15 PM  Call back number  Post procedure Call Back phone  # [phone]  Permission to leave phone message Yes     Patient questions:  Message left to call us if necessary.

## 2022-07-28 ENCOUNTER — Encounter: Payer: Self-pay | Admitting: Internal Medicine

## 2022-08-02 ENCOUNTER — Ambulatory Visit: Payer: 59 | Admitting: Cardiology

## 2022-08-02 ENCOUNTER — Encounter: Payer: Managed Care, Other (non HMO) | Admitting: Internal Medicine

## 2022-08-04 ENCOUNTER — Telehealth: Payer: Self-pay | Admitting: Internal Medicine

## 2022-08-04 ENCOUNTER — Other Ambulatory Visit: Payer: Self-pay

## 2022-08-04 DIAGNOSIS — K2970 Gastritis, unspecified, without bleeding: Secondary | ICD-10-CM

## 2022-08-04 DIAGNOSIS — R1310 Dysphagia, unspecified: Secondary | ICD-10-CM

## 2022-08-04 MED ORDER — OMEPRAZOLE 40 MG PO CPDR: 40.0000 mg | DELAYED_RELEASE_CAPSULE | Freq: Two times a day (BID) | ORAL | 0 refills | Status: AC

## 2022-08-04 NOTE — Telephone Encounter (Signed)
Inbound call from patient requesting refill on omeprazole, says he is going out of town and will run out before next refill is due. Please advise.

## 2022-08-04 NOTE — Telephone Encounter (Signed)
Refill sent to patients pharmacy. 

## 2022-09-15 ENCOUNTER — Encounter: Payer: Self-pay | Admitting: Internal Medicine

## 2022-09-30 ENCOUNTER — Encounter (HOSPITAL_COMMUNITY): Payer: Self-pay | Admitting: *Deleted

## 2023-10-28 ENCOUNTER — Encounter: Payer: Self-pay | Admitting: Internal Medicine

## 2024-02-08 ENCOUNTER — Encounter (INDEPENDENT_AMBULATORY_CARE_PROVIDER_SITE_OTHER): Payer: Self-pay

## 2024-03-08 ENCOUNTER — Encounter (INDEPENDENT_AMBULATORY_CARE_PROVIDER_SITE_OTHER): Payer: Self-pay
